# Patient Record
Sex: Female | Born: 1980 | Race: White | Hispanic: No | Marital: Married | Smoking: Never smoker
Health system: Southern US, Community
[De-identification: ages and names within clinical notes are randomized; demographics above are authoritative.]

## PROBLEM LIST (undated history)

## (undated) DIAGNOSIS — R7989 Other specified abnormal findings of blood chemistry: Secondary | ICD-10-CM

## (undated) DIAGNOSIS — R011 Cardiac murmur, unspecified: Secondary | ICD-10-CM

## (undated) DIAGNOSIS — R112 Nausea with vomiting, unspecified: Secondary | ICD-10-CM

## (undated) DIAGNOSIS — R809 Proteinuria, unspecified: Secondary | ICD-10-CM

## (undated) DIAGNOSIS — R945 Abnormal results of liver function studies: Secondary | ICD-10-CM

## (undated) HISTORY — DX: Abnormal results of liver function studies: R94.5

## (undated) HISTORY — PX: SHOULDER SURGERY: SHX246

## (undated) HISTORY — PX: TONSILLECTOMY AND ADENOIDECTOMY: SUR1326

## (undated) HISTORY — PX: PILONIDAL CYST / SINUS EXCISION: SUR543

## (undated) HISTORY — DX: Other specified abnormal findings of blood chemistry: R79.89

## (undated) HISTORY — DX: Proteinuria, unspecified: R80.9

## (undated) HISTORY — PX: FOOT SURGERY: SHX648

## (undated) HISTORY — PX: OTHER SURGICAL HISTORY: SHX169

---

## 1999-06-19 HISTORY — PX: CHOLECYSTECTOMY: SHX55

## 2005-04-23 ENCOUNTER — Other Ambulatory Visit: Admission: RE | Admit: 2005-04-23 | Discharge: 2005-04-23 | Payer: Self-pay | Admitting: Obstetrics & Gynecology

## 2008-01-01 ENCOUNTER — Inpatient Hospital Stay (HOSPITAL_COMMUNITY): Admission: AD | Admit: 2008-01-01 | Discharge: 2008-01-04 | Payer: Self-pay | Admitting: Obstetrics and Gynecology

## 2008-09-16 ENCOUNTER — Inpatient Hospital Stay (HOSPITAL_COMMUNITY): Admission: AD | Admit: 2008-09-16 | Discharge: 2008-09-16 | Payer: Self-pay | Admitting: Obstetrics & Gynecology

## 2009-03-29 ENCOUNTER — Inpatient Hospital Stay (HOSPITAL_COMMUNITY): Admission: RE | Admit: 2009-03-29 | Discharge: 2009-03-30 | Payer: Self-pay | Admitting: Obstetrics & Gynecology

## 2010-09-21 LAB — CBC
HCT: 32.7 % — ABNORMAL LOW (ref 36.0–46.0)
MCHC: 33.5 g/dL (ref 30.0–36.0)
MCV: 85.4 fL (ref 78.0–100.0)
Platelets: 288 10*3/uL (ref 150–400)
RDW: 13.8 % (ref 11.5–15.5)
RDW: 14.3 % (ref 11.5–15.5)

## 2010-09-21 LAB — CCBB MATERNAL DONOR DRAW

## 2010-11-18 ENCOUNTER — Inpatient Hospital Stay (INDEPENDENT_AMBULATORY_CARE_PROVIDER_SITE_OTHER)
Admission: RE | Admit: 2010-11-18 | Discharge: 2010-11-18 | Disposition: A | Discharge status: Home / Self Care | Payer: 59 | Source: Ambulatory Visit | Attending: Family Medicine | Admitting: Family Medicine

## 2010-11-18 DIAGNOSIS — R197 Diarrhea, unspecified: Secondary | ICD-10-CM

## 2010-11-18 DIAGNOSIS — R112 Nausea with vomiting, unspecified: Secondary | ICD-10-CM

## 2011-03-16 LAB — CBC
HCT: 27.4 — ABNORMAL LOW
HCT: 33.6 — ABNORMAL LOW
Hemoglobin: 10.9 — ABNORMAL LOW
Hemoglobin: 11.6 — ABNORMAL LOW
Hemoglobin: 9.2 — ABNORMAL LOW
MCHC: 32.5
MCHC: 33.6
MCV: 90.1
MCV: 90.9
Platelets: 241
Platelets: 301
RBC: 3.02 — ABNORMAL LOW
RBC: 3.73 — ABNORMAL LOW
RBC: 3.8 — ABNORMAL LOW
RDW: 13.5
RDW: 13.7
RDW: 13.7
WBC: 10.3
WBC: 18.5 — ABNORMAL HIGH

## 2011-03-16 LAB — RPR: RPR Ser Ql: NONREACTIVE

## 2013-02-09 ENCOUNTER — Ambulatory Visit
Admission: RE | Admit: 2013-02-09 | Discharge: 2013-02-09 | Disposition: A | Discharge status: Home / Self Care | Payer: 59 | Source: Ambulatory Visit | Attending: Internal Medicine | Admitting: Internal Medicine

## 2013-02-09 ENCOUNTER — Other Ambulatory Visit: Payer: Self-pay | Admitting: Internal Medicine

## 2013-02-09 DIAGNOSIS — R609 Edema, unspecified: Secondary | ICD-10-CM

## 2013-03-17 ENCOUNTER — Other Ambulatory Visit (HOSPITAL_COMMUNITY): Payer: Self-pay | Admitting: Internal Medicine

## 2013-03-17 DIAGNOSIS — I341 Nonrheumatic mitral (valve) prolapse: Secondary | ICD-10-CM

## 2013-03-18 ENCOUNTER — Ambulatory Visit (HOSPITAL_COMMUNITY)
Admission: RE | Admit: 2013-03-18 | Discharge: 2013-03-18 | Disposition: A | Discharge status: Home / Self Care | Payer: 59 | Source: Ambulatory Visit | Attending: Cardiology | Admitting: Cardiology

## 2013-03-18 DIAGNOSIS — I059 Rheumatic mitral valve disease, unspecified: Secondary | ICD-10-CM | POA: Insufficient documentation

## 2013-03-18 DIAGNOSIS — R072 Precordial pain: Secondary | ICD-10-CM

## 2013-03-18 DIAGNOSIS — I079 Rheumatic tricuspid valve disease, unspecified: Secondary | ICD-10-CM | POA: Insufficient documentation

## 2013-03-18 DIAGNOSIS — R079 Chest pain, unspecified: Secondary | ICD-10-CM | POA: Insufficient documentation

## 2013-03-18 DIAGNOSIS — I341 Nonrheumatic mitral (valve) prolapse: Secondary | ICD-10-CM

## 2013-03-18 NOTE — Progress Notes (Signed)
2D Echo Performed 03/18/2013    Marygrace Drought, RCS

## 2013-07-29 ENCOUNTER — Other Ambulatory Visit: Payer: Self-pay | Admitting: Internal Medicine

## 2013-07-29 DIAGNOSIS — R7402 Elevation of levels of lactic acid dehydrogenase (LDH): Secondary | ICD-10-CM

## 2013-07-29 DIAGNOSIS — R740 Nonspecific elevation of levels of transaminase and lactic acid dehydrogenase [LDH]: Principal | ICD-10-CM

## 2013-07-31 ENCOUNTER — Ambulatory Visit
Admission: RE | Admit: 2013-07-31 | Discharge: 2013-07-31 | Disposition: A | Discharge status: Home / Self Care | Payer: 59 | Source: Ambulatory Visit | Attending: Internal Medicine | Admitting: Internal Medicine

## 2013-07-31 DIAGNOSIS — R7402 Elevation of levels of lactic acid dehydrogenase (LDH): Secondary | ICD-10-CM

## 2013-07-31 DIAGNOSIS — R740 Nonspecific elevation of levels of transaminase and lactic acid dehydrogenase [LDH]: Principal | ICD-10-CM

## 2013-08-03 ENCOUNTER — Other Ambulatory Visit: Payer: Self-pay | Admitting: Internal Medicine

## 2013-08-03 DIAGNOSIS — R7989 Other specified abnormal findings of blood chemistry: Secondary | ICD-10-CM

## 2013-08-04 ENCOUNTER — Telehealth: Payer: Self-pay | Admitting: Oncology

## 2013-08-04 ENCOUNTER — Telehealth: Payer: Self-pay | Admitting: Hematology and Oncology

## 2013-08-04 NOTE — Telephone Encounter (Signed)
C/D 08/03/13 for appt. 08/07/13

## 2013-08-04 NOTE — Telephone Encounter (Signed)
new patient scheduled for 02/20 @ 10:45 w/Dr. Alvy Bimler. Referring [name] Dx-Abn UPEP, elevated LFT'S edema Welcome packet emailed.

## 2013-08-07 ENCOUNTER — Ambulatory Visit (HOSPITAL_BASED_OUTPATIENT_CLINIC_OR_DEPARTMENT_OTHER): Payer: 59 | Admitting: Hematology and Oncology

## 2013-08-07 ENCOUNTER — Encounter: Payer: Self-pay | Admitting: Hematology and Oncology

## 2013-08-07 ENCOUNTER — Telehealth: Payer: Self-pay | Admitting: Hematology and Oncology

## 2013-08-07 ENCOUNTER — Ambulatory Visit: Payer: 59

## 2013-08-07 ENCOUNTER — Encounter (INDEPENDENT_AMBULATORY_CARE_PROVIDER_SITE_OTHER): Payer: Self-pay

## 2013-08-07 VITALS — BP 124/77 | HR 58 | Temp 98.2°F | Resp 19 | Wt 198.7 lb

## 2013-08-07 DIAGNOSIS — R809 Proteinuria, unspecified: Secondary | ICD-10-CM

## 2013-08-07 HISTORY — DX: Proteinuria, unspecified: R80.9

## 2013-08-07 NOTE — Progress Notes (Signed)
[facility] CONSULT NOTE  Patient has no care team.  CHIEF COMPLAINTS/PURPOSE OF CONSULTATION:  Abnormal M spike  HISTORY OF PRESENTING ILLNESS:  Maureen Ballard 33 y.o. female is here because of abnormal blood work. The patient was noted to have abnormal liver function test and significant retention of fluids. On 07/28/2013, her primary doctor ordered a serum protein electrophoresis that came back negative. However, a spot urine for protein electrophoresis showed elevated M spike. She's been referred here for further evaluation. She denies history of abnormal bone pain or bone fracture. Patient denies history of recurrent infection or atypical infections such as shingles of meningitis. Denies chills, night sweats, anorexia or abnormal weight loss.  MEDICAL HISTORY:  Past Medical History  Diagnosis Date  . Abnormal LFTs   . Protein, urine, abnormal presence 08/07/2013    SURGICAL HISTORY: Past Surgical History  Procedure Laterality Date  . Foot surgery      4 different surgeries  . Tonsillectomy and adenoidectomy    . Cholecystectomy    . Pilonidal cyst / sinus excision      twice    SOCIAL HISTORY: History   Social History  . Marital Status: Married    Spouse Name: N/A    Number of Children: N/A  . Years of Education: N/A   Occupational History  . Not on file.   Social History Main Topics  . Smoking status: Never Smoker   . Smokeless tobacco: Never Used  . Alcohol Use: No  . Drug Use: No  . Sexual Activity: Not on file   Other Topics Concern  . Not on file   Social History Narrative  . No narrative on file    FAMILY HISTORY: History reviewed. No pertinent family history.  ALLERGIES:  has No Known Allergies.  MEDICATIONS:  Current Outpatient Prescriptions  Medication Sig Dispense Refill  . hydrochlorothiazide (HYDRODIURIL) 25 MG tablet Take 12.5 mg by mouth daily as needed.       No current facility-administered medications for this  visit.    REVIEW OF SYSTEMS:   Eyes: Denies blurriness of vision, double vision or watery eyes Ears, nose, mouth, throat, and face: Denies mucositis or sore throat Respiratory: Denies cough, dyspnea or wheezes Cardiovascular: Denies palpitation, chest discomfort or lower extremity swelling Gastrointestinal:  Denies nausea, heartburn or change in bowel habits Skin: Denies abnormal skin rashes Lymphatics: Denies new lymphadenopathy or easy bruising Neurological:Denies numbness, tingling or new weaknesses Behavioral/Psych: Mood is stable, no new changes  All other systems were reviewed with the patient and are negative.  PHYSICAL EXAMINATION: ECOG PERFORMANCE STATUS: 0 - Asymptomatic  Filed Vitals:   08/07/13 1115  BP: 124/77  Pulse: 58  Temp: 98.2 F (36.8 C)  Resp: 19   Filed Weights   08/07/13 1115  Weight: 198 lb 11.2 oz (90.13 kg)    GENERAL:alert, no distress and comfortable SKIN: skin color, texture, turgor are normal, no rashes or significant lesions EYES: normal, conjunctiva are pink and non-injected, sclera clear OROPHARYNX:no exudate, no erythema and lips, buccal mucosa, and tongue normal  NECK: supple, thyroid normal size, non-tender, without nodularity LYMPH:  no palpable lymphadenopathy in the cervical, axillary or inguinal LUNGS: clear to auscultation and percussion with normal breathing effort HEART: regular rate & rhythm and no murmurs and no lower extremity edema ABDOMEN:abdomen soft, non-tender and normal bowel sounds Musculoskeletal:no cyanosis of digits and no clubbing  PSYCH: alert & oriented x 3 with fluent speech NEURO: no focal motor/sensory deficits  LABORATORY DATA:  I have reviewed the data as listed Lab Results  Component Value Date   WBC 11.9* 03/30/2009   HGB 9.5* 03/30/2009   HCT 28.3* 03/30/2009   MCV 86.4 03/30/2009   PLT 224 03/30/2009    RADIOGRAPHIC STUDIES: I have personally reviewed the radiological images as listed and  agreed with the findings in the report. US Abdomen Complete  07/31/2013   CLINICAL DATA:  Elevated LDH  EXAM: ULTRASOUND ABDOMEN COMPLETE  COMPARISON:  None.  FINDINGS: Gallbladder:  Surgically removed.  Common bile duct:  Diameter: 3.8 mm.  Liver:  No focal lesion identified. Within normal limits in parenchymal echogenicity.  IVC:  No abnormality visualized.  Pancreas:  Visualized portion unremarkable.  Spleen:  Size and appearance within normal limits.  Right Kidney:  Length: 12.3 cm. Echogenicity within normal limits. No mass or hydronephrosis visualized.  Left Kidney:  Length: 11.3 cm. Echogenicity within normal limits. No mass or hydronephrosis visualized.  Abdominal aorta:  No aneurysm visualized.  Other findings:  None.  IMPRESSION: No acute abnormality is noted.   Electronically Signed   By: Inez Catalina M.D.   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 I recommend complete blood work and 24 hour urine collection for UPEP  I will see her back in 2 weeks to review test results. Orders Placed This Encounter  Procedures  . CBC with Differential    Standing Status: Future     Number of Occurrences:      Standing Expiration Date: 08/07/2014  . Comprehensive metabolic panel    Standing Status: Future     Number of Occurrences:      Standing Expiration Date: 08/07/2014  . IFE, Urine (with Tot Prot)    Standing Status: Future     Number of Occurrences:      Standing Expiration Date: 08/07/2014  . SPEP & IFE with QIG    Standing Status: Future     Number of Occurrences:      Standing Expiration Date: 08/07/2014  . Kappa/lambda light chains    Standing Status: Future     Number of Occurrences:      Standing Expiration Date: 08/07/2014  . Protein Electro, 24-Hour Urine    Standing Status: Future     Number of Occurrences:      Standing  Expiration Date: 08/07/2014    All questions were answered. The patient knows to call the clinic with any problems, questions or concerns. I spent 30 minutes counseling the patient face to face. The total time spent in the appointment was 40 minutes and more than 50% was on counseling.     Owings, Hallam, MD 08/07/2013 1:35 PM

## 2013-08-07 NOTE — Telephone Encounter (Signed)
gv adn printed appt sched and avs forpt for Feb and March

## 2013-08-11 ENCOUNTER — Telehealth: Payer: Self-pay | Admitting: Hematology and Oncology

## 2013-08-11 NOTE — Telephone Encounter (Signed)
returned pt call and r/s lab to later time due to school delay tomorrow....done...pt aware of new time

## 2013-08-12 ENCOUNTER — Other Ambulatory Visit: Payer: 59

## 2013-08-12 ENCOUNTER — Other Ambulatory Visit (HOSPITAL_BASED_OUTPATIENT_CLINIC_OR_DEPARTMENT_OTHER): Payer: 59

## 2013-08-12 DIAGNOSIS — R809 Proteinuria, unspecified: Secondary | ICD-10-CM

## 2013-08-12 LAB — CBC WITH DIFFERENTIAL/PLATELET
BASO%: 0.5 % (ref 0.0–2.0)
Basophils Absolute: 0 10*3/uL (ref 0.0–0.1)
EOS%: 3.2 % (ref 0.0–7.0)
Eosinophils Absolute: 0.2 10*3/uL (ref 0.0–0.5)
HCT: 44.3 % (ref 34.8–46.6)
HEMOGLOBIN: 14.7 g/dL (ref 11.6–15.9)
LYMPH#: 1.9 10*3/uL (ref 0.9–3.3)
LYMPH%: 33.7 % (ref 14.0–49.7)
MCH: 30.5 pg (ref 25.1–34.0)
MCHC: 33.2 g/dL (ref 31.5–36.0)
MCV: 91.8 fL (ref 79.5–101.0)
MONO#: 0.5 10*3/uL (ref 0.1–0.9)
MONO%: 9.5 % (ref 0.0–14.0)
NEUT#: 2.9 10*3/uL (ref 1.5–6.5)
NEUT%: 53.1 % (ref 38.4–76.8)
Platelets: 284 10*3/uL (ref 145–400)
RBC: 4.83 10*6/uL (ref 3.70–5.45)
RDW: 13.1 % (ref 11.2–14.5)
WBC: 5.5 10*3/uL (ref 3.9–10.3)

## 2013-08-12 LAB — COMPREHENSIVE METABOLIC PANEL (CC13)
ALBUMIN: 3.6 g/dL (ref 3.5–5.0)
ALT: 25 U/L (ref 0–55)
AST: 20 U/L (ref 5–34)
Alkaline Phosphatase: 43 U/L (ref 40–150)
Anion Gap: 8 mEq/L (ref 3–11)
BUN: 13.1 mg/dL (ref 7.0–26.0)
CALCIUM: 9.1 mg/dL (ref 8.4–10.4)
CHLORIDE: 103 meq/L (ref 98–109)
CO2: 29 mEq/L (ref 22–29)
Creatinine: 0.9 mg/dL (ref 0.6–1.1)
Glucose: 92 mg/dl (ref 70–140)
POTASSIUM: 4 meq/L (ref 3.5–5.1)
SODIUM: 140 meq/L (ref 136–145)
TOTAL PROTEIN: 6.2 g/dL — AB (ref 6.4–8.3)
Total Bilirubin: 0.59 mg/dL (ref 0.20–1.20)

## 2013-08-17 LAB — SPEP & IFE WITH QIG
ALBUMIN ELP: 60.2 % (ref 55.8–66.1)
Alpha-1-Globulin: 5.3 % — ABNORMAL HIGH (ref 2.9–4.9)
Alpha-2-Globulin: 10.2 % (ref 7.1–11.8)
BETA 2: 4.2 % (ref 3.2–6.5)
Beta Globulin: 8 % — ABNORMAL HIGH (ref 4.7–7.2)
Gamma Globulin: 12.1 % (ref 11.1–18.8)
IGA: 213 mg/dL (ref 69–380)
IGM, SERUM: 55 mg/dL (ref 52–322)
IgG (Immunoglobin G), Serum: 699 mg/dL (ref 690–1700)
Total Protein, Serum Electrophoresis: 6.2 g/dL (ref 6.0–8.3)

## 2013-08-17 LAB — UIFE/LIGHT CHAINS/TP QN, 24-HR UR
Albumin, U: DETECTED
FREE KAPPA LT CHAINS, UR: 0.45 mg/dL (ref 0.14–2.42)
FREE KAPPA/LAMBDA RATIO: 11.25 ratio — AB (ref 2.04–10.37)
Free Lambda Excretion/Day: 1.2 mg/d
Free Lambda Lt Chains,Ur: 0.04 mg/dL (ref 0.02–0.67)
Free Lt Chn Excr Rate: 13.5 mg/d
TOTAL PROTEIN, URINE-UPE24: 1 mg/dL
Time: 24 hours
Total Protein, Urine-Ur/day: 30 mg/d (ref 10–140)
Volume, Urine: 3000 mL

## 2013-08-17 LAB — KAPPA/LAMBDA LIGHT CHAINS
KAPPA LAMBDA RATIO: 1.44 (ref 0.26–1.65)
Kappa free light chain: 1.21 mg/dL (ref 0.33–1.94)
Lambda Free Lght Chn: 0.84 mg/dL (ref 0.57–2.63)

## 2013-08-25 ENCOUNTER — Telehealth: Payer: Self-pay | Admitting: *Deleted

## 2013-08-25 ENCOUNTER — Ambulatory Visit: Payer: 59 | Admitting: Hematology and Oncology

## 2013-08-25 NOTE — Telephone Encounter (Signed)
Informed pt of no need for office visit with Dr. Alvy Bimler.  Informed her all her tests are "clear" per Dr. Calton Dach standpoint and pt can f/u with her PCP.  Pt verbalized understanding.

## 2014-03-01 ENCOUNTER — Ambulatory Visit
Admission: RE | Admit: 2014-03-01 | Discharge: 2014-03-01 | Disposition: A | Discharge status: Home / Self Care | Payer: No Typology Code available for payment source | Source: Ambulatory Visit | Attending: Occupational Medicine | Admitting: Occupational Medicine

## 2014-03-01 ENCOUNTER — Other Ambulatory Visit: Payer: Self-pay | Admitting: Occupational Medicine

## 2014-03-01 DIAGNOSIS — Z021 Encounter for pre-employment examination: Secondary | ICD-10-CM

## 2014-06-18 HISTORY — PX: FRACTURE SURGERY: SHX138

## 2014-06-21 ENCOUNTER — Other Ambulatory Visit: Payer: Self-pay | Admitting: Occupational Medicine

## 2014-06-21 ENCOUNTER — Ambulatory Visit: Payer: Self-pay

## 2014-06-21 DIAGNOSIS — M25512 Pain in left shoulder: Secondary | ICD-10-CM

## 2014-06-21 DIAGNOSIS — M25522 Pain in left elbow: Secondary | ICD-10-CM

## 2015-03-15 ENCOUNTER — Encounter: Payer: Self-pay | Admitting: Sports Medicine

## 2015-03-15 ENCOUNTER — Ambulatory Visit (INDEPENDENT_AMBULATORY_CARE_PROVIDER_SITE_OTHER): Payer: Commercial Managed Care - HMO | Admitting: Sports Medicine

## 2015-03-15 VITALS — BP 120/70 | Ht 69.0 in | Wt 200.0 lb

## 2015-03-15 DIAGNOSIS — I8393 Asymptomatic varicose veins of bilateral lower extremities: Secondary | ICD-10-CM | POA: Diagnosis not present

## 2015-03-15 DIAGNOSIS — M7051 Other bursitis of knee, right knee: Secondary | ICD-10-CM | POA: Diagnosis not present

## 2015-03-15 MED ORDER — NITROGLYCERIN 0.2 MG/HR TD PT24: MEDICATED_PATCH | TRANSDERMAL | Status: DC

## 2015-03-15 NOTE — Assessment & Plan Note (Signed)
34 year old healthy woman with pain below right patella 8 months s/p acute trauma.  Recent xrays (done at outside facility) were reviewed with Dr. Oneida Alar and there is no evidence of patellar fracture or other bony abnormality.  Her exam reveals no ligamentous or meniscal injury.  She has a small dip in her skin just below the knee and US done in office reveals evidence of soft tissue tear.  Given these findings and hx of fall with significant injury, her pain is likely due to traumatic bursitis due to small tear in soft tissue.  The following recommendations were made to the patient. - ACE bandage - will try NTG (1/4 patch) applied to affected area on knee daily (proven effective for rotator cuff, elbow and achilles pathology so will see if it works to improve healing in this area) - return for repeat US in 6 weeks to see if the soft tissue is healing

## 2015-03-15 NOTE — Progress Notes (Signed)
Subjective:    Patient ID: Maureen Ballard, female    DOB: [birth year], 34 y.o.   MRN: [id number]  HPI Comments: Ms. Maureen Ballard is a 34 year old woman with PMH as below here with complaint of chronic knee pain x 9 months s/p fall during [employer] training.  In Jan 2016, she was taking part in training exercise and fell down 1 flight of stairs landing on face down with upper torso at bottom of stairs and legs on the upper stairs.  She hit her knee but did not feel or hear a pop.  She suffered left rotator cuff tear and left elbow dislocation and is s/p surgery to both of these areas and an additional surgery is planned for the left elbow in two months.  She is seeing Dr. Theda Sers for her rotator cuff and Dr. Jacelyn Grip for her elbow.  She is here specifically for the right knee pain since the fall.  She reports having an open wound to the left knee just below the patella immediately after the fall that healed.  The knee was giving her a little pain but this increased after she began doing more intense full body PT in May 2016.  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  She notes knee pain that is just below patella but radiates to medial joint line.  The pain is tolerable with usual activity but increases to 6/10 with cardiac exercise/running.  She finds the pain to be at its worse the day after intense exercise.  She also notices some swelling following activity.  She has not tried pain medications/NSAIDs or compression.  She finds icing helpful.      Past Medical History  Diagnosis Date  . Abnormal LFTs   . Protein, urine, abnormal presence 08/07/2013   Current Outpatient Prescriptions on File Prior to Visit  Medication Sig Dispense Refill  . hydrochlorothiazide (HYDRODIURIL) 25 MG tablet Take 12.5 mg by mouth daily as needed.     No current facility-administered medications on file prior to visit.    Review of Systems    Constitutional: Negative for appetite change and unexpected weight change.  Eyes: Negative for visual disturbance.  Gastrointestinal: Negative for nausea, vomiting, abdominal pain, diarrhea and constipation.       No change in bowel or bladder  Musculoskeletal:       Pain below right patella per HPI   Hx of varicosities in her family and she gets swelling and some vv bruising     Filed Vitals:   03/15/15 1023 03/15/15 1024  BP:  120/70  Height: 5' 9"  (1.753 m)   Weight: 200 lb (90.719 kg)     Objective:   Physical Exam  Constitutional: She is oriented to person, place, and time. She appears well-developed. No distress.  HENT:  Head: Normocephalic and atraumatic.  Mouth/Throat: Oropharynx is clear and moist. No oropharyngeal exudate.  Eyes: EOM are normal. Right eye exhibits no discharge. Left eye exhibits no discharge. No scleral icterus.  Neck: Neck supple.  Musculoskeletal: Normal range of motion. She exhibits edema. She exhibits no tenderness.  Full ROM at knee joint B/L; no joint effusion; on right - neg Lachman, neg McMurray, neg anterior/posterior drawer, neg valgus/varus stress, neg Apley compression/distraction.  Right calf 42cm, left calf 40.5cm   Neurological: She is alert and oriented to person, place, and time. She displays normal reflexes. No cranial nerve deficit.  5/5 strength lower extremities.  4-/5 strength left upper extremity, 5/5 strength right upper extremity; sensation grossly intact  Skin: Skin is warm and dry. She is not diaphoretic.  Psychiatric: She has a normal mood and affect. Her behavior is normal. Judgment and thought content normal.    [place] of RT knee and leg No SPP effusion Normal QT and PT Norm ,medial and lateral meniscus  Soft tissue just lateral to tib tubercle shows hypoechoic change consistent with pseudobursa Deep and superficial varicoisitis noted  RT knee XR is normal      Assessment & Plan:  Please see problem based charting for  assessment and plan.

## 2015-03-15 NOTE — Patient Instructions (Addendum)
Nitroglycerin Protocol   Apply 1/4 nitroglycerin patch to affected area daily.  Change position of patch within the affected area every 24 hours.  You may experience a headache during the first 1-2 weeks of using the patch, these should subside.  If you experience headaches after beginning nitroglycerin patch treatment, you may take your preferred over the counter pain reliever.  Another side effect of the nitroglycerin patch is skin irritation or rash related to patch adhesive.  Please notify our office if you develop more severe headaches or rash, and stop the patch.  Tendon healing with nitroglycerin patch may require 12 to 24 weeks depending on the extent of injury.  Men should not use if taking Viagra, Cialis, or Levitra.   Do not use if you have migraines or rosacea.   Please perform exercises we discussed Return in 6 weeks for ultrasound appointment

## 2015-03-15 NOTE — Assessment & Plan Note (Addendum)
Evident on exam and also able to see significant varicosities on [place].  This is likely contributing to slightly edematous right leg.  There is no calf pain and low risk for DVT (Wells score 0).  The following recommendations were given to patient. - compression socks/Wrangler boot socks - leg exercises - handout provided - can consider vitamin C (won't hurt and may be helpful), 30m ASA daily

## 2015-04-27 ENCOUNTER — Ambulatory Visit: Payer: Self-pay | Admitting: Sports Medicine

## 2015-08-11 ENCOUNTER — Other Ambulatory Visit: Payer: Self-pay | Admitting: Internal Medicine

## 2015-08-11 DIAGNOSIS — I8393 Asymptomatic varicose veins of bilateral lower extremities: Secondary | ICD-10-CM

## 2015-08-24 ENCOUNTER — Other Ambulatory Visit: Payer: Self-pay

## 2015-08-30 ENCOUNTER — Other Ambulatory Visit: Payer: Self-pay

## 2015-08-30 ENCOUNTER — Ambulatory Visit
Admission: RE | Admit: 2015-08-30 | Discharge: 2015-08-30 | Disposition: A | Discharge status: Home / Self Care | Payer: Commercial Managed Care - HMO | Source: Ambulatory Visit | Attending: Internal Medicine | Admitting: Internal Medicine

## 2015-08-30 DIAGNOSIS — I8393 Asymptomatic varicose veins of bilateral lower extremities: Secondary | ICD-10-CM

## 2015-08-30 NOTE — Consult Note (Signed)
Chief Complaint: I have painful varicose veins.   Referring Physician(s): Dr. Jani Gravel.   History of Present Illness: Maureen Ballard is a 35 y.o. female presenting to [facility] clinic today as a scheduled appointment, kindly referred by [name], for evaluation of her lower extremity varicose veins and possible venous insufficiency as an etiology.    Ms Maureen Ballard tells me that she has noticed, over the past year or so, increasing prominence of veins on her left greater than right lower extremity. She tells me that she also has associated throbbing pain of the left greater than right leg, at its worst and intensity of 4 or 5/10. She has no history of a prior DVT or blood clot. She has no history of any prior skin changes of the lower extremity. She has never had a prior spontaneous ulcer or wound of her lower extremity.  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 At the time of the injury, she also had a soft tissue injury to the proximal anterior right lower leg, just below her knee. She tells me that this injury required lengthy time for healing, and did get an infection though she treated this with local wound care at her house and did not require any hospitalization. It does not seem that the development of her venous varicosities is temporarily related to the injury, and she did not have a DVT at this time.  She does report a family history of venous disease, with her mother. She has a woman who has had 2 pregnancies and 2 children. She continues to exercise, though does find that she has occasional symptoms of lower extremity pain with exercise.  I inquired about symptoms of pelvic venous insufficiency, and though she does report dyspareunia, she denies any external or vulvar varicosities, and thus I feel that there is a low  chance of any pelvic congestion syndrome/venous insufficiency.    A complete directed duplex was performed on today's date, which demonstrates no evidence of DVT involving the left or right lower extremity. She has no evidence of venous reflux involving the great saphenous vein or the left circumflex saphenous vein on the left or right lower extremity. She has incidental reticular veins/varicosities of the bilateral lower extremities.    Past Medical History  Diagnosis Date  . Abnormal LFTs   . Protein, urine, abnormal presence 08/07/2013    Past Surgical History  Procedure Laterality Date  . Foot surgery      4 different surgeries  . Tonsillectomy and adenoidectomy    . Cholecystectomy    . Pilonidal cyst / sinus excision      twice  . Fracture surgery  2016     Left Elbow    Allergies: Review of patient's allergies indicates no known allergies.  Medications: Prior to Admission medications   Medication Sig Start Date End Date Taking? Authorizing Provider  hydrochlorothiazide (HYDRODIURIL) 25 MG tablet Take 12.5 mg by mouth daily as needed. Reported on 08/30/2015 06/29/13   Historical Provider, MD  nitroGLYCERIN (NITRODUR - DOSED IN MG/24 HR) 0.2 mg/hr patch Place 1/4 patch to affected area daily Patient not taking: Reported on 08/30/2015 03/15/15   Stefanie Libel, MD     No family history on file.  Social History   Social History  . Marital Status: Married    Spouse Name:  N/A  . Number of Children: N/A  . Years of Education: N/A   Social History Main Topics  . Smoking status: Never Smoker   . Smokeless tobacco: Never Used  . Alcohol Use: No  . Drug Use: No  . Sexual Activity: Not on file   Other Topics Concern  . Not on file   Social History Narrative     Review of Systems: A 12 point ROS discussed and pertinent positives are indicated in the HPI above.  All other systems are negative.  Review of Systems  Vital Signs: BP 128/69 mmHg  Pulse 55  Temp(Src) 98.3  F (36.8 C) (Oral)  Resp 15  Ht 5' 9"  (1.753 m)  Wt 200 lb (90.719 kg)  BMI 29.52 kg/m2  SpO2 96%  Physical Exam Atraumatic normocephalic mucous membranes moist pink. Conjugate gaze. No scleral icterus or scleral injection. Alert and oriented to person place and time. Appropriate affect and insight. Appropriate questions. Neck soft supple. Full range of motion cervical region. Symmetric chest excursion on inspiration and expiration. No labored breathing. Abdomen soft nontender without muscle guarding or rigidity. Genitourinary exam deferred. Moving all 4 extremities equally and grossly with gross motor and gross sensory intact. Lower extremities demonstrate no pitting edema. No skin changes of stasis dermatitis. No lipodermatosclerosis.  No wounds of the lower extremities. Faint varicose vein/reticular vein of the left distal thigh/proximal calf on the lateral aspect.   Imaging: US Venous Img Lower Bilateral  08/30/2015  CLINICAL DATA:  35 year old female with a history of onset of venous varicosities over the past year, with lower extremity pain and concern for venous reflux as etiology. EXAM: BILATERAL LOWER EXTREMITY VENOUS DOPPLER ULTRASOUND TECHNIQUE: Gray-scale sonography with graded compression, as well as color Doppler and duplex ultrasound were performed to evaluate the lower extremity deep venous systems from the level of the common femoral vein and including the common femoral, femoral, profunda femoral, popliteal and calf veins including the posterior tibial, peroneal and gastrocnemius veins when visible. The superficial great saphenous vein was also interrogated. Spectral Doppler was utilized to evaluate flow at rest and with distal augmentation maneuvers in the common femoral, femoral and popliteal veins. COMPARISON:  None. FINDINGS: RIGHT LOWER EXTREMITY Common Femoral Vein: No evidence of thrombus. Normal compressibility, respiratory phasicity and response to augmentation.  Saphenofemoral Junction: No evidence of thrombus. Normal compressibility and flow on color Doppler imaging. Profunda Femoral Vein: No evidence of thrombus. Normal compressibility and flow on color Doppler imaging. Femoral Vein: No evidence of thrombus. Normal compressibility, respiratory phasicity and response to augmentation. Popliteal Vein: No evidence of thrombus. Normal compressibility, respiratory phasicity and response to augmentation. Calf Veins: No evidence of thrombus. Normal compressibility and flow on color Doppler imaging. GSV at [facility]:  No reflux GSV at Proximal Thigh:  No reflux GSV at Mid Thigh:  No reflux GSV at Distal Thigh:  None GSV at Prox Calf: Diameter = none GSV at Mid Calf: Diameter = none GSV at Distal Calf: Diameter = none SSV at Sapheno popliteal junction: Diameter = none SSV at mid calf: Diameter = none SSF at distal calf: Diameter = none Varicosities: The patient does have reticular veins/ varicosities of the right distal thigh and proximal calf. Other Findings:  None. LEFT LOWER EXTREMITY Common Femoral Vein: No evidence of thrombus. Normal compressibility, respiratory phasicity and response to augmentation. Saphenofemoral Junction: No evidence of thrombus. Normal compressibility and flow on color Doppler imaging. Profunda Femoral Vein: No evidence of thrombus. Normal compressibility and flow  on color Doppler imaging. Femoral Vein: No evidence of thrombus. Normal compressibility, respiratory phasicity and response to augmentation. Popliteal Vein: No evidence of thrombus. Normal compressibility, respiratory phasicity and response to augmentation. Calf Veins: No evidence of thrombus. Normal compressibility and flow on color Doppler imaging. GSV at [facility]:  Diameter = No reflux GSV at Proximal Thigh: No reflux GSV at Mid Thigh: No reflux GSV at Distal Thigh: No reflux GSV at Prox Calf: No reflux GSV at Mid Calf: No reflux GSV at Distal Calf: No reflux SSV at Sapheno popliteal junction: No  reflux SSV at mid calf: No reflux SSF at distal calf: No reflux Varicosities: The patient does have reticular veins of the distal left thigh and proximal calf. Other Findings:  None. IMPRESSION: Sonographic survey of the bilateral lower extremities negative for DVT. No evidence of reflux involving the left or right great saphenous vein or small saphenous vein. Signed, Dulcy Fanny. Earleen Newport, DO [facility] Electronically Signed   By: Corrie Mckusick D.O.   On: 08/30/2015 11:45    Labs:  CBC: No results for input(s): WBC, HGB, HCT, PLT in the last 8760 hours.  COAGS: No results for input(s): INR, APTT in the last 8760 hours.  BMP: No results for input(s): NA, K, CL, CO2, GLUCOSE, BUN, CALCIUM, CREATININE, GFRNONAA, GFRAA in the last 8760 hours.  Invalid input(s): CMP  LIVER FUNCTION TESTS: No results for input(s): BILITOT, AST, ALT, ALKPHOS, PROT, ALBUMIN in the last 8760 hours.  TUMOR MARKERS: No results for input(s): AFPTM, CEA, CA199, CHROMGRNA in the last 8760 hours.  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 On duplex exam, she has a normal deep venous system, and no evidence of venous insufficiency as an etiology. She does have mild reticular veins/venous varicosities on the duplex, exam. I had a long discussion with her regarding the anatomy, pathology/pathophysiology, and potential treatment methods of venous insufficiency. I did talk to her about her absence of venous insufficiency, and that any endovenous therapy would likely not improve her symptoms. I also discussed with her conservative therapy, and the potential benefits of compression stocking therapy. We had a brief discussion about asthmatic indications for treatment, although this is typically not covered by insurance plans and performed with an  out-of-pocket payment. Regarding our discussion of the natural history of venous disease, I did talk to her about the possibility that venous disease can progress, although I see no need at this point in scheduling her for routine duplex surveillance, unless her symptoms progress significantly or she has worsening of the varicose disease. I answered all of her questions to the best of my ability, and she agrees with and understands her plan of care.  In summary, Ms Vanhook is a 35 year old woman with CEAP-1 category disease, with no measurable venous reflux.  Our plan will be to prescribe 15-20 mmHg compression thigh-high stockings, to be worn during the day time.  Thank you for this interesting consult.  I greatly enjoyed meeting ELANAH OSMANOVIC and look forward to participating in their care.  A copy of this report was sent to the requesting provider on this date.  Electronically Signed: Corrie Mckusick 08/30/2015, 11:50 AM   I spent a total of  40 Minutes   in face to face in clinical consultation, greater than 50% of which was counseling/coordinating care for lower extremity  varicose veins, and possible venous insufficiency.

## 2016-08-15 DIAGNOSIS — M7751 Other enthesopathy of right foot: Secondary | ICD-10-CM | POA: Diagnosis not present

## 2016-08-15 DIAGNOSIS — M7741 Metatarsalgia, right foot: Secondary | ICD-10-CM | POA: Diagnosis not present

## 2017-06-17 DIAGNOSIS — M1812 Unilateral primary osteoarthritis of first carpometacarpal joint, left hand: Secondary | ICD-10-CM | POA: Diagnosis not present

## 2017-06-22 ENCOUNTER — Other Ambulatory Visit: Payer: Self-pay

## 2017-06-22 ENCOUNTER — Encounter (HOSPITAL_BASED_OUTPATIENT_CLINIC_OR_DEPARTMENT_OTHER): Payer: Self-pay | Admitting: *Deleted

## 2017-06-22 DIAGNOSIS — Z79899 Other long term (current) drug therapy: Secondary | ICD-10-CM

## 2017-06-22 DIAGNOSIS — R1084 Generalized abdominal pain: Secondary | ICD-10-CM

## 2017-06-22 DIAGNOSIS — R1110 Vomiting, unspecified: Secondary | ICD-10-CM | POA: Diagnosis not present

## 2017-06-22 MED ORDER — GI COCKTAIL ~~LOC~~
30.0000 mL | Freq: Once | ORAL | Status: AC
  Administered 2017-06-22: 30 mL via ORAL
  Filled 2017-06-22: qty 30

## 2017-06-22 NOTE — ED Notes (Signed)
Pt reports she had dilaudid left over from prior surgery. Took rolaids and a muscle relaxer also. States she took melatonin to try to sleep and vomited

## 2017-06-22 NOTE — ED Triage Notes (Signed)
Pt reports epigastric pain for several days. States she vomited x 1 tonight. Pt states she took dilaudid without relief

## 2017-06-23 ENCOUNTER — Emergency Department (HOSPITAL_BASED_OUTPATIENT_CLINIC_OR_DEPARTMENT_OTHER): Payer: 59

## 2017-06-23 ENCOUNTER — Encounter (HOSPITAL_BASED_OUTPATIENT_CLINIC_OR_DEPARTMENT_OTHER): Payer: Self-pay

## 2017-06-23 ENCOUNTER — Emergency Department (HOSPITAL_BASED_OUTPATIENT_CLINIC_OR_DEPARTMENT_OTHER)
Admission: EM | Admit: 2017-06-23 | Discharge: 2017-06-23 | Disposition: A | Discharge status: Home / Self Care | Payer: 59 | Source: Home / Self Care | Attending: Physician Assistant | Admitting: Physician Assistant

## 2017-06-23 DIAGNOSIS — R1084 Generalized abdominal pain: Secondary | ICD-10-CM

## 2017-06-23 DIAGNOSIS — R1110 Vomiting, unspecified: Secondary | ICD-10-CM | POA: Diagnosis not present

## 2017-06-23 LAB — COMPREHENSIVE METABOLIC PANEL
ALK PHOS: 50 U/L (ref 38–126)
ALT: 39 U/L (ref 14–54)
AST: 37 U/L (ref 15–41)
Albumin: 4.1 g/dL (ref 3.5–5.0)
Anion gap: 9 (ref 5–15)
BILIRUBIN TOTAL: 0.6 mg/dL (ref 0.3–1.2)
BUN: 10 mg/dL (ref 6–20)
CALCIUM: 9 mg/dL (ref 8.9–10.3)
CO2: 25 mmol/L (ref 22–32)
CREATININE: 0.82 mg/dL (ref 0.44–1.00)
Chloride: 102 mmol/L (ref 101–111)
GFR calc Af Amer: >60 mL/min (ref >60)
GFR calc non Af Amer: >60 mL/min (ref >60)
GLUCOSE: 105 mg/dL — AB (ref 65–99)
Potassium: 3.7 mmol/L (ref 3.5–5.1)
SODIUM: 136 mmol/L (ref 135–145)
Total Protein: 6.9 g/dL (ref 6.5–8.1)

## 2017-06-23 LAB — URINALYSIS, MICROSCOPIC (REFLEX)

## 2017-06-23 LAB — CBC
HCT: 38.2 % (ref 36.0–46.0)
Hemoglobin: 13.5 g/dL (ref 12.0–15.0)
MCH: 32 pg (ref 26.0–34.0)
MCHC: 35.3 g/dL (ref 30.0–36.0)
MCV: 90.5 fL (ref 78.0–100.0)
PLATELETS: 344 10*3/uL (ref 150–400)
RBC: 4.22 MIL/uL (ref 3.87–5.11)
RDW: 11.8 % (ref 11.5–15.5)
WBC: 5.7 10*3/uL (ref 4.0–10.5)

## 2017-06-23 LAB — URINALYSIS, ROUTINE W REFLEX MICROSCOPIC
BILIRUBIN URINE: NEGATIVE
Glucose, UA: NEGATIVE mg/dL
KETONES UR: NEGATIVE mg/dL
Leukocytes, UA: NEGATIVE
Nitrite: NEGATIVE
PROTEIN: NEGATIVE mg/dL
SPECIFIC GRAVITY, URINE: 1.015 (ref 1.005–1.030)
pH: 6 (ref 5.0–8.0)

## 2017-06-23 LAB — LIPASE, BLOOD: Lipase: 26 U/L (ref 11–51)

## 2017-06-23 LAB — PREGNANCY, URINE: PREG TEST UR: NEGATIVE

## 2017-06-23 MED ORDER — SUCRALFATE 1 GM/10ML PO SUSP
1.0000 g | Freq: Once | ORAL | Status: AC
  Administered 2017-06-23: 1 g via ORAL
  Filled 2017-06-23: qty 10

## 2017-06-23 MED ORDER — OXYCODONE-ACETAMINOPHEN 5-325 MG PO TABS
1.0000 | ORAL_TABLET | Freq: Once | ORAL | Status: AC
  Administered 2017-06-23: 1 via ORAL
  Filled 2017-06-23: qty 1

## 2017-06-23 MED ORDER — OMEPRAZOLE 20 MG PO CPDR: 20.0000 mg | DELAYED_RELEASE_CAPSULE | Freq: Every day | ORAL | 0 refills | Status: DC

## 2017-06-23 MED ORDER — SUCRALFATE 1 GM/10ML PO SUSP: 1.0000 g | Freq: Three times a day (TID) | ORAL | 0 refills | Status: DC

## 2017-06-23 MED ORDER — OXYCODONE-ACETAMINOPHEN 5-325 MG PO TABS: 1.0000 | ORAL_TABLET | Freq: Four times a day (QID) | ORAL | 0 refills | Status: DC | PRN

## 2017-06-23 MED ORDER — IOPAMIDOL (ISOVUE-300) INJECTION 61%
100.0000 mL | Freq: Once | INTRAVENOUS | Status: AC | PRN
  Administered 2017-06-23: 100 mL via INTRAVENOUS

## 2017-06-23 MED ORDER — FENTANYL CITRATE (PF) 100 MCG/2ML IJ SOLN
50.0000 ug | Freq: Once | INTRAMUSCULAR | Status: AC
  Administered 2017-06-23: 50 ug via INTRAVENOUS
  Filled 2017-06-23: qty 2

## 2017-06-23 NOTE — ED Notes (Signed)
ED Provider at bedside. 

## 2017-06-23 NOTE — ED Provider Notes (Signed)
[place] EMERGENCY DEPARTMENT Provider Note   CSN: [id number] Arrival date & time: 06/22/17  2244     History   Chief Complaint Chief Complaint  Patient presents with  . Abdominal Pain    HPI Maureen Ballard is a 37 y.o. female.  HPI  Patient is a 37 year old female presenting with abdominal pain.  Patient says it starts a couple days ago.  She reports that it was initially in her epigastric area.  Now she reports she has some pain in her right CVA area.  She reports she vomited one time yesterday.  No vomiting otherwise.  No diarrhea.  No fevers.  No recent change in food intake.  No alcohol use.  Past Medical History:  Diagnosis Date  . Abnormal LFTs   . Protein, urine, abnormal presence 08/07/2013    Patient Active Problem List   Diagnosis Date Noted  . Infrapatellar bursitis of right knee 03/15/2015  . Varicose veins of both lower extremities 03/15/2015  . Protein, urine, abnormal presence 08/07/2013    Past Surgical History:  Procedure Laterality Date  . CHOLECYSTECTOMY    . FOOT SURGERY     4 different surgeries  . FRACTURE SURGERY  2016    Left Elbow  . PILONIDAL CYST / SINUS EXCISION     twice  . SHOULDER SURGERY    . TONSILLECTOMY AND ADENOIDECTOMY      OB History    No data available       Home Medications    Prior to Admission medications   Medication Sig Start Date End Date Taking? Authorizing Provider  hydrochlorothiazide (HYDRODIURIL) 25 MG tablet Take 12.5 mg by mouth daily as needed. Reported on 08/30/2015 06/29/13  Yes [provider]  nitroGLYCERIN (NITRODUR - DOSED IN MG/24 HR) 0.2 mg/hr patch Place 1/4 patch to affected area daily 03/15/15   Stefanie Libel, MD  omeprazole (PRILOSEC) 20 MG capsule Take 1 capsule (20 mg total) by mouth daily. 06/23/17   Jeanice Dempsey Lyn, MD  oxyCODONE-acetaminophen (PERCOCET/ROXICET) 5-325 MG tablet Take 1 tablet by mouth every 6 (six) hours as needed for severe pain. 06/23/17    Brookelynn Hamor Lyn, MD  sucralfate (CARAFATE) 1 GM/10ML suspension Take 10 mLs (1 g total) by mouth 4 (four) times daily -  with meals and at bedtime. 06/23/17   Tomekia Helton, Fredia Sorrow, MD    Family History No family history on file.  Social History Social History   Tobacco Use  . Smoking status: Never Smoker  . Smokeless tobacco: Never Used  Substance Use Topics  . Alcohol use: No  . Drug use: No     Allergies   Hydrocodone   Review of Systems Review of Systems  Constitutional: Negative for activity change and fever.  Respiratory: Negative for shortness of breath.   Cardiovascular: Negative for chest pain.  Gastrointestinal: Positive for abdominal pain and vomiting. Negative for blood in stool, diarrhea and nausea.  Genitourinary: Positive for flank pain. Negative for menstrual problem.  Musculoskeletal: Negative for back pain.     Physical Exam Updated Vital Signs BP (!) 152/73 (BP Location: Right Arm)   Pulse 69   Temp 98.5 F (36.9 C) (Oral)   Resp 20   SpO2 100%   Physical Exam  Constitutional: She is oriented to person, place, and time. She appears well-developed and well-nourished.  HENT:  Head: Normocephalic and atraumatic.  Eyes: Right eye exhibits no discharge.  Cardiovascular: Normal rate, regular rhythm and normal heart sounds.  No murmur heard. Pulmonary/Chest: Effort normal and breath sounds normal. She has no wheezes. She has no rales.  Abdominal: Soft. Bowel sounds are normal. She exhibits no distension. There is tenderness.  Epigastric tenderness.  No CVA tenderness.  No abdominal tenderness otherwise.  Neurological: She is oriented to person, place, and time.  Skin: Skin is warm and dry. She is not diaphoretic.  Psychiatric: She has a normal mood and affect.  Nursing note and vitals reviewed.    ED Treatments / Results  Labs (all labs ordered are listed, but only abnormal results are displayed) Labs Reviewed  COMPREHENSIVE METABOLIC  PANEL - Abnormal; Notable for the following components:      Result Value   Glucose, Bld 105 (*)    All other components within normal limits  URINALYSIS, ROUTINE W REFLEX MICROSCOPIC - Abnormal; Notable for the following components:   Hgb urine dipstick TRACE (*)    All other components within normal limits  URINALYSIS, MICROSCOPIC (REFLEX) - Abnormal; Notable for the following components:   Bacteria, UA FEW (*)    Squamous Epithelial / LPF 0-5 (*)    All other components within normal limits  LIPASE, BLOOD  CBC  PREGNANCY, URINE    EKG  EKG Interpretation  Date/Time:  Saturday June 22 2017 23:04:30 EST Ventricular Rate:  67 PR Interval:  162 QRS Duration: 90 QT Interval:  418 QTC Calculation: 441 R Axis:   73 Text Interpretation:  Normal sinus rhythm with sinus arrhythmia Normal ECG Normal sinus rhythm Confirmed by Thomasene Lot, Nasya Vincent [phone]) on 06/23/2017 1:36:34 AM       Radiology Ct Abdomen Pelvis W Contrast  Result Date: 06/23/2017 CLINICAL DATA:  Epigastric pain for several days. Vomiting yesterday. EXAM: CT ABDOMEN AND PELVIS WITH CONTRAST TECHNIQUE: Multidetector CT imaging of the abdomen and pelvis was performed using the standard protocol following bolus administration of intravenous contrast. CONTRAST:  135m ISOVUE-300 IOPAMIDOL (ISOVUE-300) INJECTION 61% COMPARISON:  None. FINDINGS: Lower chest: Lung bases are clear. Hepatobiliary: Mild periportal edema. This can represent normal variation but can also be associated with hepatitis. No focal liver abnormality is seen. Status post cholecystectomy. No biliary dilatation. Pancreas: Unremarkable. No pancreatic ductal dilatation or surrounding inflammatory changes. Spleen: Normal in size without focal abnormality. Adrenals/Urinary Tract: Adrenal glands are unremarkable. Kidneys are normal, without renal calculi, focal lesion, or hydronephrosis. Bladder is unremarkable. Stomach/Bowel: Stomach is within normal limits. Appendix  appears normal. No evidence of bowel wall thickening, distention, or inflammatory changes. Vascular/Lymphatic: No significant vascular findings are present. No enlarged abdominal or pelvic lymph nodes. Reproductive: Uterus and bilateral adnexa are unremarkable. Small amount of free fluid in the pelvis is probably physiologic. Other: No abdominal wall hernia or abnormality. No abdominopelvic ascites. Musculoskeletal: No acute or significant osseous findings. IMPRESSION: 1. Mild periportal hepatic edema. This may represent normal variation but can be associated with hepatitis. No hepatic abscess or focal lesion. 2. No evidence of bowel obstruction or inflammation. 3. Small amount of free fluid in the pelvis is likely physiologic. Electronically Signed   By: WLucienne CapersM.D.   On: 06/23/2017 03:37    Procedures Procedures (including critical care time)  Medications Ordered in ED Medications  oxyCODONE-acetaminophen (PERCOCET/ROXICET) 5-325 MG per tablet 1 tablet (not administered)  gi cocktail (Maalox,Lidocaine,Donnatal) (30 mLs Oral Given 06/22/17 2307)  sucralfate (CARAFATE) 1 GM/10ML suspension 1 g (1 g Oral Given 06/23/17 0155)  fentaNYL (SUBLIMAZE) injection 50 mcg (50 mcg Intravenous Given 06/23/17 0154)  iopamidol (ISOVUE-300) 61 % injection  100 mL (100 mLs Intravenous Contrast Given 06/23/17 0309)     Initial Impression / Assessment and Plan / ED Course  I have reviewed the triage vital signs and the nursing notes.  Pertinent labs & imaging results that were available during my care of the patient were reviewed by me and considered in my medical decision making (see chart for details).    Patient is a 37 year old female presenting with abdominal pain.  Patient says it starts a couple days ago.  She reports that it was initially in her epigastric area.  Now she reports she has some pain in her right CVA area.  She reports she vomited one time yesterday.  No vomiting otherwise.  No diarrhea.   No fevers.  No recent change in food intake.  No alcohol use.   1:39 AM Very atypical sounding abdominal pain.  Sounds epigastric nature however she states that the GI cocktail did not work.  I am concerned about PUD versus gastritis.  However given the tenderness on exam will get CT.  Patient is pacing room because she is in so much pain.  Which is more typical for a stone however none of her other symptoms line up with kidney stone.   4:32 AM Patient's lab work all reassuring.  Patient CT showed only small amount of fluid in the perihepatic region.  Patient has normal AST and ALT so doubt hepatitis.  Patient still acting bizarrely, keeping eyes shut, constantly trying to move positions to get comfortable.  Because of this I am less concerned about an intra-abdominal issue which would cause her to want to remain still.  Patient has been eating and drinking normally, no fevers.  And reassuring labs and CT.  I do not know what further action to take.  We will treat her for peptic ulcer disease.   Final Clinical Impressions(s) / ED Diagnoses   Final diagnoses:  Generalized abdominal pain    ED Discharge Orders        Ordered    omeprazole (PRILOSEC) 20 MG capsule  Daily     06/23/17 0432    sucralfate (CARAFATE) 1 GM/10ML suspension  3 times daily with meals & bedtime     06/23/17 0432    oxyCODONE-acetaminophen (PERCOCET/ROXICET) 5-325 MG tablet  Every 6 hours PRN     06/23/17 0432       Macarthur Critchley, MD 06/23/17 [phone]

## 2017-06-23 NOTE — Discharge Instructions (Signed)
Your vital signs and physical exam are reassuring.  Your labs were all normal.  And your CT just showed a small amount of fluid around your liver.  We could not find a cause for your abdominal pain today.  It could be that you are having peptic ulcer disease.  Please take the medications provided and follow-up with your primary care physician for repeat labs and reevaluation Monday or Tuesday.

## 2017-06-24 ENCOUNTER — Encounter (HOSPITAL_COMMUNITY): Payer: Self-pay | Admitting: Emergency Medicine

## 2017-06-24 ENCOUNTER — Emergency Department (HOSPITAL_COMMUNITY): Payer: 59

## 2017-06-24 ENCOUNTER — Other Ambulatory Visit: Payer: Self-pay

## 2017-06-24 ENCOUNTER — Other Ambulatory Visit (HOSPITAL_COMMUNITY): Payer: Self-pay

## 2017-06-24 ENCOUNTER — Inpatient Hospital Stay (HOSPITAL_COMMUNITY)
Admission: EM | Admit: 2017-06-24 | Discharge: 2017-06-28 | DRG: 387 | Disposition: A | Discharge status: Home / Self Care | Payer: 59 | Attending: Internal Medicine | Admitting: Internal Medicine

## 2017-06-24 DIAGNOSIS — Z6832 Body mass index (BMI) 32.0-32.9, adult: Secondary | ICD-10-CM

## 2017-06-24 DIAGNOSIS — Z8042 Family history of malignant neoplasm of prostate: Secondary | ICD-10-CM

## 2017-06-24 DIAGNOSIS — R1011 Right upper quadrant pain: Secondary | ICD-10-CM | POA: Diagnosis not present

## 2017-06-24 DIAGNOSIS — R933 Abnormal findings on diagnostic imaging of other parts of digestive tract: Secondary | ICD-10-CM | POA: Diagnosis not present

## 2017-06-24 DIAGNOSIS — R7989 Other specified abnormal findings of blood chemistry: Secondary | ICD-10-CM

## 2017-06-24 DIAGNOSIS — E669 Obesity, unspecified: Secondary | ICD-10-CM | POA: Diagnosis present

## 2017-06-24 DIAGNOSIS — R1013 Epigastric pain: Secondary | ICD-10-CM | POA: Diagnosis not present

## 2017-06-24 DIAGNOSIS — R109 Unspecified abdominal pain: Secondary | ICD-10-CM | POA: Diagnosis present

## 2017-06-24 DIAGNOSIS — R112 Nausea with vomiting, unspecified: Secondary | ICD-10-CM | POA: Diagnosis not present

## 2017-06-24 DIAGNOSIS — Z9089 Acquired absence of other organs: Secondary | ICD-10-CM

## 2017-06-24 DIAGNOSIS — M62830 Muscle spasm of back: Secondary | ICD-10-CM | POA: Diagnosis not present

## 2017-06-24 DIAGNOSIS — Z9049 Acquired absence of other specified parts of digestive tract: Secondary | ICD-10-CM

## 2017-06-24 DIAGNOSIS — M549 Dorsalgia, unspecified: Secondary | ICD-10-CM | POA: Diagnosis not present

## 2017-06-24 DIAGNOSIS — K5000 Crohn's disease of small intestine without complications: Principal | ICD-10-CM | POA: Diagnosis present

## 2017-06-24 DIAGNOSIS — K319 Disease of stomach and duodenum, unspecified: Secondary | ICD-10-CM | POA: Diagnosis present

## 2017-06-24 DIAGNOSIS — Z8349 Family history of other endocrine, nutritional and metabolic diseases: Secondary | ICD-10-CM

## 2017-06-24 DIAGNOSIS — R932 Abnormal findings on diagnostic imaging of liver and biliary tract: Secondary | ICD-10-CM | POA: Diagnosis not present

## 2017-06-24 DIAGNOSIS — E860 Dehydration: Secondary | ICD-10-CM | POA: Diagnosis present

## 2017-06-24 DIAGNOSIS — R001 Bradycardia, unspecified: Secondary | ICD-10-CM | POA: Diagnosis not present

## 2017-06-24 DIAGNOSIS — Z885 Allergy status to narcotic agent status: Secondary | ICD-10-CM

## 2017-06-24 DIAGNOSIS — R945 Abnormal results of liver function studies: Secondary | ICD-10-CM

## 2017-06-24 HISTORY — DX: Nausea with vomiting, unspecified: R11.2

## 2017-06-24 LAB — COMPREHENSIVE METABOLIC PANEL
ALK PHOS: 44 U/L (ref 38–126)
ALT: 38 U/L (ref 14–54)
AST: 35 U/L (ref 15–41)
Albumin: 3.9 g/dL (ref 3.5–5.0)
Anion gap: 10 (ref 5–15)
BUN: 6 mg/dL (ref 6–20)
CALCIUM: 8.9 mg/dL (ref 8.9–10.3)
CO2: 24 mmol/L (ref 22–32)
CREATININE: 0.7 mg/dL (ref 0.44–1.00)
Chloride: 101 mmol/L (ref 101–111)
Glucose, Bld: 112 mg/dL — ABNORMAL HIGH (ref 65–99)
Potassium: 3.8 mmol/L (ref 3.5–5.1)
Sodium: 135 mmol/L (ref 135–145)
Total Bilirubin: 1.1 mg/dL (ref 0.3–1.2)
Total Protein: 6.6 g/dL (ref 6.5–8.1)

## 2017-06-24 LAB — CBC
HCT: 37.9 % (ref 36.0–46.0)
Hemoglobin: 12.6 g/dL (ref 12.0–15.0)
MCH: 30.2 pg (ref 26.0–34.0)
MCHC: 33.2 g/dL (ref 30.0–36.0)
MCV: 90.9 fL (ref 78.0–100.0)
PLATELETS: 300 10*3/uL (ref 150–400)
RBC: 4.17 MIL/uL (ref 3.87–5.11)
RDW: 12 % (ref 11.5–15.5)
WBC: 6 10*3/uL (ref 4.0–10.5)

## 2017-06-24 LAB — URINALYSIS, ROUTINE W REFLEX MICROSCOPIC
Bilirubin Urine: NEGATIVE
GLUCOSE, UA: NEGATIVE mg/dL
KETONES UR: 20 mg/dL — AB
Leukocytes, UA: NEGATIVE
Nitrite: NEGATIVE
PH: 6 (ref 5.0–8.0)
Protein, ur: NEGATIVE mg/dL
SPECIFIC GRAVITY, URINE: 1.009 (ref 1.005–1.030)

## 2017-06-24 LAB — I-STAT BETA HCG BLOOD, ED (MC, WL, AP ONLY): I-stat hCG, quantitative: <5 m[IU]/mL (ref <5)

## 2017-06-24 LAB — LIPASE, BLOOD: Lipase: 28 U/L (ref 11–51)

## 2017-06-24 MED ORDER — ACETAMINOPHEN 650 MG RE SUPP: 650.0000 mg | Freq: Four times a day (QID) | RECTAL | Status: DC | PRN

## 2017-06-24 MED ORDER — ONDANSETRON HCL 4 MG PO TABS: 4.0000 mg | ORAL_TABLET | Freq: Four times a day (QID) | ORAL | Status: DC | PRN

## 2017-06-24 MED ORDER — ONDANSETRON HCL 4 MG/2ML IJ SOLN
4.0000 mg | Freq: Once | INTRAMUSCULAR | Status: AC
  Administered 2017-06-24: 4 mg via INTRAVENOUS
  Filled 2017-06-24: qty 2

## 2017-06-24 MED ORDER — ONDANSETRON HCL 4 MG PO TABS
4.0000 mg | ORAL_TABLET | Freq: Four times a day (QID) | ORAL | Status: DC | PRN
  Filled 2017-06-24: qty 1

## 2017-06-24 MED ORDER — PANTOPRAZOLE SODIUM 40 MG IV SOLR
40.0000 mg | Freq: Two times a day (BID) | INTRAVENOUS | Status: DC
Start: 2017-06-24 — End: 2017-06-28
  Administered 2017-06-24 – 2017-06-28 (×8): 40 mg via INTRAVENOUS
  Filled 2017-06-24 (×8): qty 40

## 2017-06-24 MED ORDER — MORPHINE SULFATE (PF) 4 MG/ML IV SOLN
4.0000 mg | Freq: Once | INTRAVENOUS | Status: AC
  Administered 2017-06-24: 4 mg via INTRAVENOUS
  Filled 2017-06-24: qty 1

## 2017-06-24 MED ORDER — MORPHINE SULFATE (PF) 4 MG/ML IV SOLN
2.0000 mg | INTRAVENOUS | Status: DC | PRN
  Administered 2017-06-24 – 2017-06-25 (×6): 2 mg via INTRAVENOUS
  Filled 2017-06-24 (×6): qty 1

## 2017-06-24 MED ORDER — PROMETHAZINE HCL 25 MG/ML IJ SOLN
25.0000 mg | Freq: Four times a day (QID) | INTRAMUSCULAR | Status: DC | PRN
  Administered 2017-06-24 – 2017-06-26 (×8): 25 mg via INTRAVENOUS
  Filled 2017-06-24 (×8): qty 1

## 2017-06-24 MED ORDER — ACETAMINOPHEN 325 MG PO TABS: 650.0000 mg | ORAL_TABLET | Freq: Four times a day (QID) | ORAL | Status: DC | PRN

## 2017-06-24 MED ORDER — DOCUSATE SODIUM 100 MG PO CAPS
100.0000 mg | ORAL_CAPSULE | Freq: Two times a day (BID) | ORAL | Status: DC
  Administered 2017-06-24 – 2017-06-27 (×6): 100 mg via ORAL
  Filled 2017-06-24 (×8): qty 1

## 2017-06-24 MED ORDER — ONDANSETRON HCL 4 MG/2ML IJ SOLN
4.0000 mg | Freq: Four times a day (QID) | INTRAMUSCULAR | Status: DC | PRN
  Administered 2017-06-24: 4 mg via INTRAVENOUS

## 2017-06-24 MED ORDER — GADOBENATE DIMEGLUMINE 529 MG/ML IV SOLN
20.0000 mL | Freq: Once | INTRAVENOUS | Status: AC | PRN
  Administered 2017-06-24: 20 mL via INTRAVENOUS

## 2017-06-24 MED ORDER — SODIUM CHLORIDE 0.9 % IV SOLN
8.0000 mg | Freq: Four times a day (QID) | INTRAVENOUS | Status: DC | PRN
  Administered 2017-06-25 – 2017-06-26 (×3): 8 mg via INTRAVENOUS
  Filled 2017-06-24 (×6): qty 4

## 2017-06-24 MED ORDER — ONDANSETRON HCL 4 MG/2ML IJ SOLN
INTRAMUSCULAR | Status: AC
  Filled 2017-06-24: qty 2

## 2017-06-24 MED ORDER — LACTATED RINGERS IV SOLN
INTRAVENOUS | Status: DC
  Administered 2017-06-24 – 2017-06-28 (×8): via INTRAVENOUS

## 2017-06-24 MED ORDER — LORAZEPAM 2 MG/ML IJ SOLN
1.0000 mg | Freq: Once | INTRAMUSCULAR | Status: AC
  Administered 2017-06-24: 1 mg via INTRAVENOUS
  Filled 2017-06-24: qty 1

## 2017-06-24 MED ORDER — SODIUM CHLORIDE 0.9 % IV BOLUS (SEPSIS)
500.0000 mL | Freq: Once | INTRAVENOUS | Status: AC
  Administered 2017-06-24: 500 mL via INTRAVENOUS

## 2017-06-24 NOTE — H&P (Signed)
History and Physical    CARLIS BURNSWORTH IHK:[id number] DOB: July 19, 1980 DOA: 06/24/2017  PCP: Jani Gravel, MD Consultants:  None Patient coming from:  Home - lives with spouse and 2 children; NOK: spouse, [phone]  Chief Complaint: abdominal pain  HPI: Maureen Ballard is a 37 y.o. female with no significant past medical history presenting with abdominal pain.   Abdominal pain started last Wednesday "kind of dull and just escalated from there".  She was seen overnight at [facility] Saturday night.  It continued to bother her and she began vomiting more since and so she returned.  The pain is epigastric primarily with a bit of radiation into the RUQ.  She has started to have some discomfort in her back.  She has so much pain that she has to walk constantly to keep the pain at [place], but the pain medication gives her about an hour of reprieve (she is currently in that hour).  She vomited initially about 830 on Saturday night.  It "escalated to nonstop vomiting" yesterday and she vomited 10 times through the night.  No fever.  Last BM was yesterday AM and was normal.  She has tried heat, ice, biofreeze, muscle relaxants, pain pills.  GI cocktail did not help at [facility]  She took 2 NSAIDs yesterday after 2pm but she has not otherwise taken any OTC medications.  She was prescribed Prilosec and Carafate at [facility] and has not filled those medications.   ED Course:   [facility] evaluation yesterday AM - normal labs and CT.  Today with recurrent pain - [place] nonspecific.  MRCP done with ?TI involvement.  Ongoing significant pain.  GI to see - ?needs procedure.  Review of Systems: As per HPI; otherwise review of systems reviewed and negative.   Ambulatory Status:  Ambulates without assistance  Past Medical History:  Diagnosis Date  . Abnormal LFTs   . Protein, urine, abnormal presence 08/07/2013    Past Surgical History:  Procedure Laterality Date  . CHOLECYSTECTOMY  2001  . FOOT SURGERY     4 different surgeries    . FRACTURE SURGERY  2016    Left Elbow  . PILONIDAL CYST / SINUS EXCISION     twice  . SHOULDER SURGERY    . TONSILLECTOMY AND ADENOIDECTOMY      Social History   Socioeconomic History  . Marital status: Married    Spouse name: Not on file  . Number of children: Not on file  . Years of education: Not on file  . Highest education level: Not on file  Social Needs  . Financial resource strain: Not on file  . Food insecurity - worry: Not on file  . Food insecurity - inability: Not on file  . Transportation needs - medical: Not on file  . Transportation needs - non-medical: Not on file  Occupational History  . Occupation: homemaker  Tobacco Use  . Smoking status: Never Smoker  . Smokeless tobacco: Never Used  Substance and Sexual Activity  . Alcohol use: No  . Drug use: No  . Sexual activity: Not on file  Other Topics Concern  . Not on file  Social History Narrative  . Not on file    Allergies  Allergen Reactions  . Hydrocodone Nausea And Vomiting    Family History  Problem Relation Age of Onset  . Other Mother        homicide  . Prostate cancer Father   . Anorexia nervosa Sister  Prior to Admission medications   Medication Sig Start Date End Date Taking? Authorizing Provider  nitroGLYCERIN (NITRODUR - DOSED IN MG/24 HR) 0.2 mg/hr patch Place 1/4 patch to affected area daily Patient not taking: Reported on 06/24/2017 03/15/15   Stefanie Libel, MD  omeprazole (PRILOSEC) 20 MG capsule Take 1 capsule (20 mg total) by mouth daily. 06/23/17   Mackuen, Courteney Lyn, MD  oxyCODONE-acetaminophen (PERCOCET/ROXICET) 5-325 MG tablet Take 1 tablet by mouth every 6 (six) hours as needed for severe pain. Patient not taking: Reported on 06/24/2017 06/23/17   Mackuen, Courteney Lyn, MD  sucralfate (CARAFATE) 1 GM/10ML suspension Take 10 mLs (1 g total) by mouth 4 (four) times daily -  with meals and at bedtime. 06/23/17   Mackuen, Fredia Sorrow, MD    Physical Exam: Vitals:    06/24/17 1301 06/24/17 1330 06/24/17 1345 06/24/17 1400  BP: 100/77 121/84  137/71  Pulse: 80 (!) 51 (!) 45 (!) 53  Resp:      Temp:      TempSrc:      SpO2: (!) 87% 100% 100% 100%  Weight:      Height:         General:  Appears calm and comfortable and is NAD Eyes:  PERRL, EOMI, normal lids, iris ENT:  grossly normal hearing, lips & tongue, mmm; appropriate dentition Neck:  no LAD, masses or thyromegaly Cardiovascular:  RRR, no m/r/g. No LE edema.  Respiratory:   CTA bilaterally with no wheezes/rales/rhonchi.  Normal respiratory effort. Abdomen:  soft, midepigastric TTP that reproduces the pain; otherwise NT, ND, NABS Back:   normal alignment, no CVAT Skin:  no rash or induration seen on limited exam Musculoskeletal:  grossly normal tone BUE/BLE, good ROM, no bony abnormality Psychiatric:  grossly normal mood and affect, speech fluent and appropriate, AOx3 Neurologic:  CN 2-12 grossly intact, moves all extremities in coordinated fashion, sensation intact    Radiological Exams on Admission: Ct Abdomen Pelvis W Contrast  Result Date: 06/23/2017 CLINICAL DATA:  Epigastric pain for several days. Vomiting yesterday. EXAM: CT ABDOMEN AND PELVIS WITH CONTRAST TECHNIQUE: Multidetector CT imaging of the abdomen and pelvis was performed using the standard protocol following bolus administration of intravenous contrast. CONTRAST:  168m ISOVUE-300 IOPAMIDOL (ISOVUE-300) INJECTION 61% COMPARISON:  None. FINDINGS: Lower chest: Lung bases are clear. Hepatobiliary: Mild periportal edema. This can represent normal variation but can also be associated with hepatitis. No focal liver abnormality is seen. Status post cholecystectomy. No biliary dilatation. Pancreas: Unremarkable. No pancreatic ductal dilatation or surrounding inflammatory changes. Spleen: Normal in size without focal abnormality. Adrenals/Urinary Tract: Adrenal glands are unremarkable. Kidneys are normal, without renal calculi, focal  lesion, or hydronephrosis. Bladder is unremarkable. Stomach/Bowel: Stomach is within normal limits. Appendix appears normal. No evidence of bowel wall thickening, distention, or inflammatory changes. Vascular/Lymphatic: No significant vascular findings are present. No enlarged abdominal or pelvic lymph nodes. Reproductive: Uterus and bilateral adnexa are unremarkable. Small amount of free fluid in the pelvis is probably physiologic. Other: No abdominal wall hernia or abnormality. No abdominopelvic ascites. Musculoskeletal: No acute or significant osseous findings. IMPRESSION: 1. Mild periportal hepatic edema. This may represent normal variation but can be associated with hepatitis. No hepatic abscess or focal lesion. 2. No evidence of bowel obstruction or inflammation. 3. Small amount of free fluid in the pelvis is likely physiologic. Electronically Signed   By: WLucienne CapersM.D.   On: 06/23/2017 03:37   Mr 3d Recon At Scanner  Result Date: 06/24/2017  CLINICAL DATA:  Epigastric abdominal pain and vomiting. History of cholecystectomy. Nonspecific periportal edema on CT and ultrasound. EXAM: MRI ABDOMEN WITHOUT AND WITH CONTRAST (INCLUDING MRCP) TECHNIQUE: Multiplanar multisequence MR imaging of the abdomen was performed both before and after the administration of intravenous contrast. Heavily T2-weighted images of the biliary and pancreatic ducts were obtained, and three-dimensional MRCP images were rendered by post processing. CONTRAST:  40m MULTIHANCE GADOBENATE DIMEGLUMINE 529 MG/ML IV SOLN COMPARISON:  06/24/2017 abdominal sonogram. 06/23/2017 CT abdomen/ pelvis. FINDINGS: Lower chest: No acute abnormality at the lung bases. Hepatobiliary: Normal liver size and configuration. No hepatic steatosis. No liver mass. Cholecystectomy. There is mild nonspecific periportal edema throughout the liver, most prominent in the anterior right lower lobe. Bile ducts are within normal post cholecystectomy limits. Common  bile duct diameter 7 mm. No evidence of choledocholithiasis or biliary mass. Pancreas: No pancreatic mass. No pancreatic duct dilation. Pancreatic duct is diminutive and not well visualized, precluding assessment for pancreas divisum. No significant peripancreatic edema or fluid collections. Spleen: Normal size. No mass. Adrenals/Urinary Tract: Normal adrenals. No hydronephrosis. Normal kidneys with no renal mass. Stomach/Bowel: Grossly normal stomach. Normal caliber visualized small and large bowel. There is wall thickening and mucosal hyperenhancement in the terminal ileum (series 12/image 79), not appreciably changed from the CT study from 1 day prior. Vascular/Lymphatic: Normal caliber abdominal aorta. Patent portal, splenic, hepatic and renal veins. No pathologically enlarged lymph nodes in the abdomen. Other: No abdominal ascites or focal fluid collection. Musculoskeletal: No aggressive appearing focal osseous lesions. IMPRESSION: 1. Wall thickening and mucosal hyperenhancement in the terminal ileum, compatible with a nonspecific infectious or inflammatory terminal ileitis. Differential includes Crohn disease. Consider GI consultation, as clinically warranted. 2. Bile ducts are within normal post cholecystectomy limits. CBD diameter 7 mm. No choledocholithiasis. 3. Nonspecific mild periportal edema in the liver. This is commonly due to IV fluid resuscitation in the ER setting. Electronically Signed   By: JIlona SorrelM.D.   On: 06/24/2017 12:44   Dg Abdomen Acute W/chest  Result Date: 06/24/2017 CLINICAL DATA:  Epigastric pain with nausea and vomiting for the past 3 days. EXAM: DG ABDOMEN ACUTE W/ 1V CHEST COMPARISON:  CT abdomen pelvis dated June 23 2017. Chest x-ray dated March 01, 2014. FINDINGS: There is no evidence of dilated bowel loops or free intraperitoneal air. Fall contrast within the colon. No radiopaque calculi or other significant radiographic abnormality is seen. Prior cholecystectomy.  Heart size and mediastinal contours are within normal limits. Both lungs are clear. IMPRESSION: 1. Negative abdominal radiographs. 2. No acute cardiopulmonary disease. Electronically Signed   By: WTitus DubinM.D.   On: 06/24/2017 10:16   Mr Abdomen Mrcp W Wo Contast  Result Date: 06/24/2017 CLINICAL DATA:  Epigastric abdominal pain and vomiting. History of cholecystectomy. Nonspecific periportal edema on CT and ultrasound. EXAM: MRI ABDOMEN WITHOUT AND WITH CONTRAST (INCLUDING MRCP) TECHNIQUE: Multiplanar multisequence MR imaging of the abdomen was performed both before and after the administration of intravenous contrast. Heavily T2-weighted images of the biliary and pancreatic ducts were obtained, and three-dimensional MRCP images were rendered by post processing. CONTRAST:  250mMULTIHANCE GADOBENATE DIMEGLUMINE 529 MG/ML IV SOLN COMPARISON:  06/24/2017 abdominal sonogram. 06/23/2017 CT abdomen/ pelvis. FINDINGS: Lower chest: No acute abnormality at the lung bases. Hepatobiliary: Normal liver size and configuration. No hepatic steatosis. No liver mass. Cholecystectomy. There is mild nonspecific periportal edema throughout the liver, most prominent in the anterior right lower lobe. Bile ducts are within normal post cholecystectomy  limits. Common bile duct diameter 7 mm. No evidence of choledocholithiasis or biliary mass. Pancreas: No pancreatic mass. No pancreatic duct dilation. Pancreatic duct is diminutive and not well visualized, precluding assessment for pancreas divisum. No significant peripancreatic edema or fluid collections. Spleen: Normal size. No mass. Adrenals/Urinary Tract: Normal adrenals. No hydronephrosis. Normal kidneys with no renal mass. Stomach/Bowel: Grossly normal stomach. Normal caliber visualized small and large bowel. There is wall thickening and mucosal hyperenhancement in the terminal ileum (series 12/image 79), not appreciably changed from the CT study from 1 day prior.  Vascular/Lymphatic: Normal caliber abdominal aorta. Patent portal, splenic, hepatic and renal veins. No pathologically enlarged lymph nodes in the abdomen. Other: No abdominal ascites or focal fluid collection. Musculoskeletal: No aggressive appearing focal osseous lesions. IMPRESSION: 1. Wall thickening and mucosal hyperenhancement in the terminal ileum, compatible with a nonspecific infectious or inflammatory terminal ileitis. Differential includes Crohn disease. Consider GI consultation, as clinically warranted. 2. Bile ducts are within normal post cholecystectomy limits. CBD diameter 7 mm. No choledocholithiasis. 3. Nonspecific mild periportal edema in the liver. This is commonly due to IV fluid resuscitation in the ER setting. Electronically Signed   By: Ilona Sorrel M.D.   On: 06/24/2017 12:44   US Abdomen Limited Ruq  Result Date: 06/24/2017 CLINICAL DATA:  37 year old female with right upper quadrant pain and abnormal liver function tests. Post cholecystectomy. Subsequent encounter. EXAM: ULTRASOUND ABDOMEN LIMITED RIGHT UPPER QUADRANT COMPARISON:  06/23/2017 CT. FINDINGS: Gallbladder: Surgically removed. Common bile duct: Diameter: 3 mm proximally and 6.9 mm distally. Liver: Mild prominence central hepatic ducts. Mild periportal edema. Prominence of the hepatic vein confluence and inferior vena cava. Portal vein is patent on color Doppler imaging with normal direction of blood flow towards the liver. IMPRESSION: Nonspecific periportal edema. The additional finding of prominence of the hepatic veins and inferior vena cava raises possibility of increased right heart pressure (atypical for patient's age). Alternatively, periportal edema could be related to hepatitis in the proper clinical setting. Post cholecystectomy. The slight prominence of the central intrahepatic biliary ducts and distal common bile duct most likely related to post cholecystectomy state. No obstructing common bile duct stone detected.  If patient had persistent elevated liver function studies not explained by hepatitis or other abnormality then follow-up MRCP/liver MR could be obtained for further delineation if clinically desired. Electronically Signed   By: Genia Del M.D.   On: 06/24/2017 10:37    EKG: not done   Labs on Admission: I have personally reviewed the available labs and imaging studies at the time of the admission.  Pertinent labs:   UA: small Hgb, 20 ketones, many bacteria HCG negative Glucose 112 CMP otherwise WNL CBC WNL   Assessment/Plan Principal Problem:   Abdominal pain   -Patient with midepigastric abdominal pain x several days -Seen at [facility], not improved with GI cocktail; discharged with pain medication, PPI, Carafate - not picked up -She has had an extensive evaluation including labs, CT, and MRI; overall this has been very reassuring -I was called for intractable pain, which is currently controlled with pain medication -The MRI was mildly abnormal with vague nonspecific findings at the TI; this does not appear to correlate with the patient's symptoms -GI was consulted and would like to evaluate the patient in the hospital -Patient is NPO; if EGD could be accomplished today, she could potentially even be discharged today.  Otherwise, suspect that patient will be appropriate for d/c in AM. -She is very mildly dehydrated based on  her UA so will continue IVF -Will start IV PPI BID, as I suspect that this pain is related to PUD -Pain control with 2 mg IV morphine q2h prn   DVT prophylaxis: Early ambulation Code Status:  Full - confirmed with patient Family Communication: None present Disposition Plan:  Home once clinically improved Consults called: GI  Admission status: It is my clinical opinion that referral for OBSERVATION is reasonable and necessary in this patient based on the above information provided. The aforementioned taken together are felt to place the patient at high risk for  further clinical deterioration. However it is anticipated that the patient may be medically stable for discharge from the hospital within 24 to 48 hours.     Karmen Bongo MD [facility]  If note is complete, please contact covering daytime or nighttime physician. [email or web address] Password [email or web address]  06/24/2017, 2:07 PM

## 2017-06-24 NOTE — ED Notes (Signed)
Patient transported to CT 

## 2017-06-24 NOTE — Progress Notes (Signed)
Pt c/o abd pain 7/10.  Morphine 74m IV given with no relief.  MD notified.  Will continue to monitor.  [place]liezer [place]

## 2017-06-24 NOTE — ED Provider Notes (Signed)
Emergency Department Provider Note   I have reviewed the triage vital signs and the nursing notes.   HISTORY  Chief Complaint Abdominal Pain   HPI Maureen Ballard is a 37 y.o. female presents to the emergency department for evaluation of worsening epigastric abdominal pain with nausea and vomiting.  The patient estimates 10 episodes of vomiting in the past 24 hours.  Her epigastric abdominal pain is worsening since her emergency department evaluation and the scan on approximately 36 hours ago.  Patient has a history of cholecystectomy.  She denies any blood in the emesis.  No associated diarrhea.  No lower abdominal pain.  No vaginal bleeding or discharge.  No dysuria, hesitancy, urgency.  No similar pain in the past.  30 days ago she traveled to [place] but has felt fine until this episode began. Some radiation of symptoms to the back.    Past Medical History:  Diagnosis Date  . Abnormal LFTs   . Nausea & vomiting 06/24/2017  . Protein, urine, abnormal presence 08/07/2013    Patient Active Problem List   Diagnosis Date Noted  . Abdominal pain 06/24/2017  . Infrapatellar bursitis of right knee 03/15/2015  . Varicose veins of both lower extremities 03/15/2015  . Protein, urine, abnormal presence 08/07/2013    Past Surgical History:  Procedure Laterality Date  . CHOLECYSTECTOMY  2001  . FOOT SURGERY     4 different surgeries  . FRACTURE SURGERY  2016    Left Elbow  . PILONIDAL CYST / SINUS EXCISION     twice  . SHOULDER SURGERY    . TONSILLECTOMY AND ADENOIDECTOMY        Allergies Hydrocodone  Family History  Problem Relation Age of Onset  . Other Mother        homicide  . Prostate cancer Father   . Anorexia nervosa Sister     Social History Social History   Tobacco Use  . Smoking status: Never Smoker  . Smokeless tobacco: Never Used  Substance Use Topics  . Alcohol use: No  . Drug use: No    Review of Systems  Constitutional: No  fever/chills Eyes: No visual changes. ENT: No sore throat. Cardiovascular: Denies chest pain. Respiratory: Denies shortness of breath. Gastrointestinal: Positive epigastric abdominal pain. Positive nausea and vomiting.  No diarrhea.  No constipation. Genitourinary: Negative for dysuria. Musculoskeletal: Positive for back pain. Skin: Negative for rash. Neurological: Negative for headaches, focal weakness or numbness.  10-point ROS otherwise negative.  ____________________________________________   PHYSICAL EXAM:  VITAL SIGNS: ED Triage Vitals  Enc Vitals Group     BP 06/24/17 0747 122/74     Pulse Rate 06/24/17 0747 (!) 51     Resp 06/24/17 0747 16     Temp 06/24/17 0747 (!) 97.5 F (36.4 C)     Temp Source 06/24/17 0747 Oral     SpO2 06/24/17 0747 100 %     Weight 06/24/17 0748 220 lb (99.8 kg)     Height 06/24/17 0748 5' 9"  (1.753 m)     Pain Score 06/24/17 0802 8    Constitutional: Alert and oriented. Appears uncomfortable. Standing at bedside and shifting frequently.  Eyes: Conjunctivae are normal.  Head: Atraumatic. Nose: No congestion/rhinnorhea. Mouth/Throat: Mucous membranes are moist.  Oropharynx non-erythematous. Neck: No stridor.  Cardiovascular: Bradycardia. Good peripheral circulation. Grossly normal heart sounds.   Respiratory: Normal respiratory effort.  No retractions. Lungs CTAB. Gastrointestinal: Soft with focal RUQ and epigastric abdominal pain. No rebound or  guarding. Mild discomfort in the lower abdomen without focal tenderness. No distention.  Musculoskeletal: No lower extremity tenderness nor edema. No gross deformities of extremities. Neurologic:  Normal speech and language. No gross focal neurologic deficits are appreciated.  Skin:  Skin is warm, dry and intact. No rash noted.  ____________________________________________   LABS (all labs ordered are listed, but only abnormal results are displayed)  Labs Reviewed  COMPREHENSIVE METABOLIC  PANEL - Abnormal; Notable for the following components:      Result Value   Glucose, Bld 112 (*)    All other components within normal limits  URINALYSIS, ROUTINE W REFLEX MICROSCOPIC - Abnormal; Notable for the following components:   Hgb urine dipstick SMALL (*)    Ketones, ur 20 (*)    Bacteria, UA MANY (*)    Squamous Epithelial / LPF 0-5 (*)    All other components within normal limits  LIPASE, BLOOD  CBC  HIV ANTIBODY (ROUTINE TESTING)  BASIC METABOLIC PANEL  CBC  I-STAT BETA HCG BLOOD, ED ([facility], [facility], [facility] ONLY)   ____________________________________________  RADIOLOGY  Mr 3d Recon At Scanner  Result Date: 06/24/2017 CLINICAL DATA:  Epigastric abdominal pain and vomiting. History of cholecystectomy. Nonspecific periportal edema on CT and ultrasound. EXAM: MRI ABDOMEN WITHOUT AND WITH CONTRAST (INCLUDING MRCP) TECHNIQUE: Multiplanar multisequence MR imaging of the abdomen was performed both before and after the administration of intravenous contrast. Heavily T2-weighted images of the biliary and pancreatic ducts were obtained, and three-dimensional MRCP images were rendered by post processing. CONTRAST:  38m MULTIHANCE GADOBENATE DIMEGLUMINE 529 MG/ML IV SOLN COMPARISON:  06/24/2017 abdominal sonogram. 06/23/2017 CT abdomen/ pelvis. FINDINGS: Lower chest: No acute abnormality at the lung bases. Hepatobiliary: Normal liver size and configuration. No hepatic steatosis. No liver mass. Cholecystectomy. There is mild nonspecific periportal edema throughout the liver, most prominent in the anterior right lower lobe. Bile ducts are within normal post cholecystectomy limits. Common bile duct diameter 7 mm. No evidence of choledocholithiasis or biliary mass. Pancreas: No pancreatic mass. No pancreatic duct dilation. Pancreatic duct is diminutive and not well visualized, precluding assessment for pancreas divisum. No significant peripancreatic edema or fluid collections. Spleen: Normal size. No mass.  Adrenals/Urinary Tract: Normal adrenals. No hydronephrosis. Normal kidneys with no renal mass. Stomach/Bowel: Grossly normal stomach. Normal caliber visualized small and large bowel. There is wall thickening and mucosal hyperenhancement in the terminal ileum (series 12/image 79), not appreciably changed from the CT study from 1 day prior. Vascular/Lymphatic: Normal caliber abdominal aorta. Patent portal, splenic, hepatic and renal veins. No pathologically enlarged lymph nodes in the abdomen. Other: No abdominal ascites or focal fluid collection. Musculoskeletal: No aggressive appearing focal osseous lesions. IMPRESSION: 1. Wall thickening and mucosal hyperenhancement in the terminal ileum, compatible with a nonspecific infectious or inflammatory terminal ileitis. Differential includes Crohn disease. Consider GI consultation, as clinically warranted. 2. Bile ducts are within normal post cholecystectomy limits. CBD diameter 7 mm. No choledocholithiasis. 3. Nonspecific mild periportal edema in the liver. This is commonly due to IV fluid resuscitation in the ER setting. Electronically Signed   By: JIlona SorrelM.D.   On: 06/24/2017 12:44   Dg Abdomen Acute W/chest  Result Date: 06/24/2017 CLINICAL DATA:  Epigastric pain with nausea and vomiting for the past 3 days. EXAM: DG ABDOMEN ACUTE W/ 1V CHEST COMPARISON:  CT abdomen pelvis dated June 23 2017. Chest x-ray dated March 01, 2014. FINDINGS: There is no evidence of dilated bowel loops or free intraperitoneal air. Fall contrast within  the colon. No radiopaque calculi or other significant radiographic abnormality is seen. Prior cholecystectomy. Heart size and mediastinal contours are within normal limits. Both lungs are clear. IMPRESSION: 1. Negative abdominal radiographs. 2. No acute cardiopulmonary disease. Electronically Signed   By: Titus Dubin M.D.   On: 06/24/2017 10:16   Mr Abdomen Mrcp W Wo Contast  Result Date: 06/24/2017 CLINICAL DATA:   Epigastric abdominal pain and vomiting. History of cholecystectomy. Nonspecific periportal edema on CT and ultrasound. EXAM: MRI ABDOMEN WITHOUT AND WITH CONTRAST (INCLUDING MRCP) TECHNIQUE: Multiplanar multisequence MR imaging of the abdomen was performed both before and after the administration of intravenous contrast. Heavily T2-weighted images of the biliary and pancreatic ducts were obtained, and three-dimensional MRCP images were rendered by post processing. CONTRAST:  28m MULTIHANCE GADOBENATE DIMEGLUMINE 529 MG/ML IV SOLN COMPARISON:  06/24/2017 abdominal sonogram. 06/23/2017 CT abdomen/ pelvis. FINDINGS: Lower chest: No acute abnormality at the lung bases. Hepatobiliary: Normal liver size and configuration. No hepatic steatosis. No liver mass. Cholecystectomy. There is mild nonspecific periportal edema throughout the liver, most prominent in the anterior right lower lobe. Bile ducts are within normal post cholecystectomy limits. Common bile duct diameter 7 mm. No evidence of choledocholithiasis or biliary mass. Pancreas: No pancreatic mass. No pancreatic duct dilation. Pancreatic duct is diminutive and not well visualized, precluding assessment for pancreas divisum. No significant peripancreatic edema or fluid collections. Spleen: Normal size. No mass. Adrenals/Urinary Tract: Normal adrenals. No hydronephrosis. Normal kidneys with no renal mass. Stomach/Bowel: Grossly normal stomach. Normal caliber visualized small and large bowel. There is wall thickening and mucosal hyperenhancement in the terminal ileum (series 12/image 79), not appreciably changed from the CT study from 1 day prior. Vascular/Lymphatic: Normal caliber abdominal aorta. Patent portal, splenic, hepatic and renal veins. No pathologically enlarged lymph nodes in the abdomen. Other: No abdominal ascites or focal fluid collection. Musculoskeletal: No aggressive appearing focal osseous lesions. IMPRESSION: 1. Wall thickening and mucosal  hyperenhancement in the terminal ileum, compatible with a nonspecific infectious or inflammatory terminal ileitis. Differential includes Crohn disease. Consider GI consultation, as clinically warranted. 2. Bile ducts are within normal post cholecystectomy limits. CBD diameter 7 mm. No choledocholithiasis. 3. Nonspecific mild periportal edema in the liver. This is commonly due to IV fluid resuscitation in the ER setting. Electronically Signed   By: JIlona SorrelM.D.   On: 06/24/2017 12:44   [place] Limited Ruq  Result Date: 06/24/2017 CLINICAL DATA:  37year old female with right upper quadrant pain and abnormal liver function tests. Post cholecystectomy. Subsequent encounter. EXAM: ULTRASOUND ABDOMEN LIMITED RIGHT UPPER QUADRANT COMPARISON:  06/23/2017 CT. FINDINGS: Gallbladder: Surgically removed. Common bile duct: Diameter: 3 mm proximally and 6.9 mm distally. Liver: Mild prominence central hepatic ducts. Mild periportal edema. Prominence of the hepatic vein confluence and inferior vena cava. Portal vein is patent on color Doppler imaging with normal direction of blood flow towards the liver. IMPRESSION: Nonspecific periportal edema. The additional finding of prominence of the hepatic veins and inferior vena cava raises possibility of increased right heart pressure (atypical for patient's age). Alternatively, periportal edema could be related to hepatitis in the proper clinical setting. Post cholecystectomy. The slight prominence of the central intrahepatic biliary ducts and distal common bile duct most likely related to post cholecystectomy state. No obstructing common bile duct stone detected. If patient had persistent elevated liver function studies not explained by hepatitis or other abnormality then follow-up MRCP/liver MR could be obtained for further delineation if clinically desired. Electronically Signed  By: Genia Del M.D.   On: 06/24/2017 10:37     ____________________________________________   PROCEDURES  Procedure(s) performed:   Procedures  None ____________________________________________   INITIAL IMPRESSION / ASSESSMENT AND PLAN / ED COURSE  Pertinent labs & imaging results that were available during my care of the patient were reviewed by me and considered in my medical decision making (see chart for details).  Patient presents to the emergency department with continued epigastric and right upper quadrant abdominal pain with worsening nausea and vomiting.  Patient was evaluated in the emergency department approximately 36 hours ago with CT scan showing some nonspecific inflammation near the liver and likely physiologic free fluid in the pelvis.  Liver enzymes and bilirubin are within normal limits here.  Patient does have small amount of blood in the urine as well as many bacteria but no other symptoms or lab findings to suggest UTI.  Plan for plain film of the abdomen to evaluate for possible obstruction to explain her increase in vomiting.  Patient has had a cholecystectomy but plan to obtain right upper quadrant ultrasound to evaluate her bile duct and consider possible cholelithiasis.  10:44 AM [place] results discussed with the patient. Continues to have pain but it is decreased. Plan to discuss utility of MRCP with GI given continued symptoms and US findings. Normal T. Bili and LFTs.   11:04 AM Spoke with Dr. Collene Mares with GI. Reviewed the [place] results and history by phone. Recommends MRCP and call with discussion of results.   01:22 PM Reviewed MRCP results with Dr. Collene Mares with GI. She will see in consult today to advise on additional w/u. Pain is difficult to control here in the ED. Patient receiving 3rd dose of Morphine and still looks uncomfortable. Plan for admission for pain control and continued w/u with GI. Patient's PCP is [name].   Discussed patient's case with Hospitalist, Dr. Lorin Mercy to request admission. Patient  and family (if present) updated with plan. Care transferred to Hospitalist service.  I reviewed all nursing notes, vitals, pertinent old records, EKGs, labs, imaging (as available).  ____________________________________________  FINAL CLINICAL IMPRESSION(S) / ED DIAGNOSES  Final diagnoses:  RUQ abdominal pain  Non-intractable vomiting with nausea, unspecified vomiting type     MEDICATIONS GIVEN DURING THIS VISIT:  Medications  acetaminophen (TYLENOL) tablet 650 mg (not administered)    Or  acetaminophen (TYLENOL) suppository 650 mg (not administered)  docusate sodium (COLACE) capsule 100 mg (not administered)  lactated ringers infusion ( Intravenous New Bag/Given 06/24/17 1511)  pantoprazole (PROTONIX) injection 40 mg (not administered)  morphine 4 MG/ML injection 2 mg (2 mg Intravenous Given 06/24/17 1829)  ondansetron (ZOFRAN) tablet 4 mg (not administered)    Or  ondansetron (ZOFRAN) 8 mg in sodium chloride 0.9 % 50 mL IVPB (not administered)  promethazine (PHENERGAN) injection 25 mg (25 mg Intravenous Given 06/24/17 1829)  sodium chloride 0.9 % bolus 500 mL (0 mLs Intravenous Stopped 06/24/17 1123)  morphine 4 MG/ML injection 4 mg (4 mg Intravenous Given 06/24/17 0930)  ondansetron (ZOFRAN) injection 4 mg (4 mg Intravenous Given 06/24/17 0930)  morphine 4 MG/ML injection 4 mg (4 mg Intravenous Given 06/24/17 1121)  gadobenate dimeglumine (MULTIHANCE) injection 20 mL (20 mLs Intravenous Contrast Given 06/24/17 1216)  morphine 4 MG/ML injection 4 mg (4 mg Intravenous Given 06/24/17 1324)  ondansetron (ZOFRAN) injection 4 mg (4 mg Intravenous Given 06/24/17 1332)  LORazepam (ATIVAN) injection 1 mg (1 mg Intravenous Given 06/24/17 1604)  ondansetron (ZOFRAN) 4 MG/2ML  injection (  Duplicate 11/17/82 8592)    Note:  This document was prepared using Dragon voice recognition software and may include unintentional dictation errors.  Nanda Quinton, MD Emergency Medicine   Latima Hamza, [facility], MD 06/24/17  [phone]

## 2017-06-24 NOTE — ED Triage Notes (Signed)
Pt states she has been having epigastric pain since Wednesday and seen had [facility]. Pt states she has vomited 10 times in the last 24 hours.

## 2017-06-24 NOTE — ED Notes (Signed)
Patient transported to X-ray and [place]

## 2017-06-24 NOTE — H&P (View-Only) (Signed)
UNASSIGNED PATIENT Reason for Consult: Severe abdominal pain. Referring Physician: THP  [name] is an 37 y.o. female.  HPI: Maureen Ballard is a 37 year old white female, who was in her usual state of health till about 4 days ago when she developed epigastric pain and mild reflux. Patient seems to be having severe abdominal pain and is unable to give me much of the details on her history and therefore most the details have been procured from my conversation with her husband, who is at the bedside. Patient initially tried some Rolaids to help with epigastric pain but that did not help her symptoms. She claims her pain became progressively severe over the last 4 days and became unbearable she went came to the emergency room at at the [facility] ER on [facility] when she was evaluated and sent home after the CT scan of t revealed mild periportal hepatic edema, small amount of free fluid in the pelvis and no evidence of bowel obstruction or inflammation. She came back to the emergency room as her symptoms worsened and had normal abdominal series followed by an abdominal ultrasound that showed mild nonspecific periportal edema with prominence of the hepatic veins and inferior vena cava and slightly dilated intrahepatic ducts and distal common bile duct; no obstructing common bile duct stone was made noted and the gallbladder was surgically absent.  A follow-up MRI was done as her symptoms could not be explained with the above tests and on the MRI she was noted to have wall thickening and mucosal hyperenhancement in the terminal ileum question infectious versus inflammatory slightly but dilated bile duct at 7 mm consistent with post cholecystomy cystectomy status with no evidence of choledocholithiasis. Again nonspecific mild periportal edema was noted  the patient denies any use of alcohol or drugs. She was healthy at her baseline when her symptoms started 4 days ago. She denies a previous history of  pancreatitis. She's had an ulcer in college but could not tell me if she had H. pylori infection or not She takes occasional nonsteroidals but not on a daily basis. She has  had multiple orthopedic surgeries and has taken Dilaudid in the past. She has regular bowel movements with no Heberden's of melena or hematochezia. There is no history of nephrolithiasis. She denies a previous history of acid reflux dysphagia odynophagia. Prior to the onset of her symptoms fairly good appetite and her weight was weight had been stable. Her abdominal pain was accompanied with nausea and vomiting that has been refractory in the last 24 hours prompting her to come to the emergency room. She denies having any coffee-ground emesis or hematemesis   Past Medical History:  Diagnosis Date  . Abnormal LFTs   . Protein, urine, abnormal presence 08/07/2013   Past Surgical History:  Procedure Laterality Date  . CHOLECYSTECTOMY  2001  . FOOT SURGERY     4 different surgeries  . FRACTURE SURGERY  2016    Left Elbow  . PILONIDAL CYST / SINUS EXCISION     twice  . SHOULDER SURGERY    . TONSILLECTOMY AND ADENOIDECTOMY     Family History  Problem Relation Age of Onset  . Other Mother        homicide  . Prostate cancer Father   . Anorexia nervosa Sister    Social History:  reports that  has never smoked. she has never used smokeless tobacco. She reports that she does not drink alcohol or use drugs. She is a homemaker,  Allergies:  Allergies  Allergen Reactions  . Hydrocodone Nausea And Vomiting   Medications: I have reviewed the patient's current medications.  Results for orders placed or performed during the hospital encounter of 06/24/17 (from the past 48 hour(s))  Lipase, blood     Status: None   Collection Time: 06/24/17  8:00 AM  Result Value Ref Range   Lipase 28 11 - 51 U/L  Comprehensive metabolic panel     Status: Abnormal   Collection Time: 06/24/17  8:00 AM  Result Value Ref Range   Sodium 135  135 - 145 mmol/L   Potassium 3.8 3.5 - 5.1 mmol/L   Chloride 101 101 - 111 mmol/L   CO2 24 22 - 32 mmol/L   Glucose, Bld 112 (H) 65 - 99 mg/dL   BUN 6 6 - 20 mg/dL   Creatinine, Ser 0.70 0.44 - 1.00 mg/dL   Calcium 8.9 8.9 - 10.3 mg/dL   Total Protein 6.6 6.5 - 8.1 g/dL   Albumin 3.9 3.5 - 5.0 g/dL   AST 35 15 - 41 U/L   ALT 38 14 - 54 U/L   Alkaline Phosphatase 44 38 - 126 U/L   Total Bilirubin 1.1 0.3 - 1.2 mg/dL   GFR calc non Af Amer >60 >60 mL/min   GFR calc Af Amer >60 >60 mL/min    Comment: (NOTE) The eGFR has been calculated using the CKD EPI equation. This calculation has not been validated in all clinical situations. eGFR's persistently <60 mL/min signify possible Chronic Kidney Disease.    Anion gap 10 5 - 15  CBC     Status: None   Collection Time: 06/24/17  8:00 AM  Result Value Ref Range   WBC 6.0 4.0 - 10.5 K/uL   RBC 4.17 3.87 - 5.11 MIL/uL   Hemoglobin 12.6 12.0 - 15.0 g/dL   HCT 37.9 36.0 - 46.0 %   MCV 90.9 78.0 - 100.0 fL   MCH 30.2 26.0 - 34.0 pg   MCHC 33.2 30.0 - 36.0 g/dL   RDW 12.0 11.5 - 15.5 %   Platelets 300 150 - 400 K/uL  Urinalysis, Routine w reflex microscopic     Status: Abnormal   Collection Time: 06/24/17  8:04 AM  Result Value Ref Range   Color, Urine YELLOW YELLOW   APPearance CLEAR CLEAR   Specific Gravity, Urine 1.009 1.005 - 1.030   pH 6.0 5.0 - 8.0   Glucose, UA NEGATIVE NEGATIVE mg/dL   Hgb urine dipstick SMALL (A) NEGATIVE   Bilirubin Urine NEGATIVE NEGATIVE   Ketones, ur 20 (A) NEGATIVE mg/dL   Protein, ur NEGATIVE NEGATIVE mg/dL   Nitrite NEGATIVE NEGATIVE   Leukocytes, UA NEGATIVE NEGATIVE   RBC / HPF 0-5 0 - 5 RBC/hpf   WBC, UA 0-5 0 - 5 WBC/hpf   Bacteria, UA MANY (A) NONE SEEN   Squamous Epithelial / LPF 0-5 (A) NONE SEEN   Mucus PRESENT   I-Stat beta hCG blood, ED     Status: None   Collection Time: 06/24/17  8:08 AM  Result Value Ref Range   I-stat hCG, quantitative <5.0 <5 mIU/mL   Comment 3             Comment:   GEST. AGE      CONC.  (mIU/mL)   <=1 WEEK        5 - 50     2 WEEKS       50 - 500       3 WEEKS       100 - 10,000     4 WEEKS     1,000 - 30,000        FEMALE AND NON-PREGNANT FEMALE:     LESS THAN 5 mIU/mL     Ct Abdomen Pelvis W Contrast  Result Date: 06/23/2017 CLINICAL DATA:  Epigastric pain for several days. Vomiting yesterday. EXAM: CT ABDOMEN AND PELVIS WITH CONTRAST TECHNIQUE: Multidetector CT imaging of the abdomen and pelvis was performed using the standard protocol following bolus administration of intravenous contrast. CONTRAST:  142m ISOVUE-300 IOPAMIDOL (ISOVUE-300) INJECTION 61% COMPARISON:  None. FINDINGS: Lower chest: Lung bases are clear. Hepatobiliary: Mild periportal edema. This can represent normal variation but can also be associated with hepatitis. No focal liver abnormality is seen. Status post cholecystectomy. No biliary dilatation. Pancreas: Unremarkable. No pancreatic ductal dilatation or surrounding inflammatory changes. Spleen: Normal in size without focal abnormality. Adrenals/Urinary Tract: Adrenal glands are unremarkable. Kidneys are normal, without renal calculi, focal lesion, or hydronephrosis. Bladder is unremarkable. Stomach/Bowel: Stomach is within normal limits. Appendix appears normal. No evidence of bowel wall thickening, distention, or inflammatory changes. Vascular/Lymphatic: No significant vascular findings are present. No enlarged abdominal or pelvic lymph nodes. Reproductive: Uterus and bilateral adnexa are unremarkable. Small amount of free fluid in the pelvis is probably physiologic. Other: No abdominal wall hernia or abnormality. No abdominopelvic ascites. Musculoskeletal: No acute or significant osseous findings. IMPRESSION: 1. Mild periportal hepatic edema. This may represent normal variation but can be associated with hepatitis. No hepatic abscess or focal lesion. 2. No evidence of bowel obstruction or inflammation. 3. Small amount of free  fluid in the pelvis is likely physiologic. Electronically Signed   By: WLucienne CapersM.D.   On: 06/23/2017 03:37   Mr 3d Recon At Scanner  Result Date: 06/24/2017 CLINICAL DATA:  Epigastric abdominal pain and vomiting. History of cholecystectomy. Nonspecific periportal edema on CT and ultrasound. EXAM: MRI ABDOMEN WITHOUT AND WITH CONTRAST (INCLUDING MRCP) TECHNIQUE: Multiplanar multisequence MR imaging of the abdomen was performed both before and after the administration of intravenous contrast. Heavily T2-weighted images of the biliary and pancreatic ducts were obtained, and three-dimensional MRCP images were rendered by post processing. CONTRAST:  26mMULTIHANCE GADOBENATE DIMEGLUMINE 529 MG/ML IV SOLN COMPARISON:  06/24/2017 abdominal sonogram. 06/23/2017 CT abdomen/ pelvis. FINDINGS: Lower chest: No acute abnormality at the lung bases. Hepatobiliary: Normal liver size and configuration. No hepatic steatosis. No liver mass. Cholecystectomy. There is mild nonspecific periportal edema throughout the liver, most prominent in the anterior right lower lobe. Bile ducts are within normal post cholecystectomy limits. Common bile duct diameter 7 mm. No evidence of choledocholithiasis or biliary mass. Pancreas: No pancreatic mass. No pancreatic duct dilation. Pancreatic duct is diminutive and not well visualized, precluding assessment for pancreas divisum. No significant peripancreatic edema or fluid collections. Spleen: Normal size. No mass. Adrenals/Urinary Tract: Normal adrenals. No hydronephrosis. Normal kidneys with no renal mass. Stomach/Bowel: Grossly normal stomach. Normal caliber visualized small and large bowel. There is wall thickening and mucosal hyperenhancement in the terminal ileum (series 12/image 79), not appreciably changed from the CT study from 1 day prior. Vascular/Lymphatic: Normal caliber abdominal aorta. Patent portal, splenic, hepatic and renal veins. No pathologically enlarged lymph nodes  in the abdomen. Other: No abdominal ascites or focal fluid collection. Musculoskeletal: No aggressive appearing focal osseous lesions. IMPRESSION: 1. Wall thickening and mucosal hyperenhancement in the terminal ileum, compatible with a nonspecific infectious or inflammatory terminal ileitis. Differential includes Crohn disease.  Consider GI consultation, as clinically warranted. 2. Bile ducts are within normal post cholecystectomy limits. CBD diameter 7 mm. No choledocholithiasis. 3. Nonspecific mild periportal edema in the liver. This is commonly due to IV fluid resuscitation in the ER setting. Electronically Signed   By: Ilona Sorrel M.D.   On: 06/24/2017 12:44   Dg Abdomen Acute W/chest  Result Date: 06/24/2017 CLINICAL DATA:  Epigastric pain with nausea and vomiting for the past 3 days. EXAM: DG ABDOMEN ACUTE W/ 1V CHEST COMPARISON:  CT abdomen pelvis dated June 23 2017. Chest x-ray dated March 01, 2014. FINDINGS: There is no evidence of dilated bowel loops or free intraperitoneal air. Fall contrast within the colon. No radiopaque calculi or other significant radiographic abnormality is seen. Prior cholecystectomy. Heart size and mediastinal contours are within normal limits. Both lungs are clear. IMPRESSION: 1. Negative abdominal radiographs. 2. No acute cardiopulmonary disease. Electronically Signed   By: Titus Dubin M.D.   On: 06/24/2017 10:16   Mr Abdomen Mrcp W Wo Contast  Result Date: 06/24/2017 CLINICAL DATA:  Epigastric abdominal pain and vomiting. History of cholecystectomy. Nonspecific periportal edema on CT and ultrasound. EXAM: MRI ABDOMEN WITHOUT AND WITH CONTRAST (INCLUDING MRCP) TECHNIQUE: Multiplanar multisequence MR imaging of the abdomen was performed both before and after the administration of intravenous contrast. Heavily T2-weighted images of the biliary and pancreatic ducts were obtained, and three-dimensional MRCP images were rendered by post processing. CONTRAST:  67m  MULTIHANCE GADOBENATE DIMEGLUMINE 529 MG/ML IV SOLN COMPARISON:  06/24/2017 abdominal sonogram. 06/23/2017 CT abdomen/ pelvis. FINDINGS: Lower chest: No acute abnormality at the lung bases. Hepatobiliary: Normal liver size and configuration. No hepatic steatosis. No liver mass. Cholecystectomy. There is mild nonspecific periportal edema throughout the liver, most prominent in the anterior right lower lobe. Bile ducts are within normal post cholecystectomy limits. Common bile duct diameter 7 mm. No evidence of choledocholithiasis or biliary mass. Pancreas: No pancreatic mass. No pancreatic duct dilation. Pancreatic duct is diminutive and not well visualized, precluding assessment for pancreas divisum. No significant peripancreatic edema or fluid collections. Spleen: Normal size. No mass. Adrenals/Urinary Tract: Normal adrenals. No hydronephrosis. Normal kidneys with no renal mass. Stomach/Bowel: Grossly normal stomach. Normal caliber visualized small and large bowel. There is wall thickening and mucosal hyperenhancement in the terminal ileum (series 12/image 79), not appreciably changed from the CT study from 1 day prior. Vascular/Lymphatic: Normal caliber abdominal aorta. Patent portal, splenic, hepatic and renal veins. No pathologically enlarged lymph nodes in the abdomen. Other: No abdominal ascites or focal fluid collection. Musculoskeletal: No aggressive appearing focal osseous lesions. IMPRESSION: 1. Wall thickening and mucosal hyperenhancement in the terminal ileum, compatible with a nonspecific infectious or inflammatory terminal ileitis. Differential includes Crohn disease. Consider GI consultation, as clinically warranted. 2. Bile ducts are within normal post cholecystectomy limits. CBD diameter 7 mm. No choledocholithiasis. 3. Nonspecific mild periportal edema in the liver. This is commonly due to IV fluid resuscitation in the ER setting. Electronically Signed   By: JIlona SorrelM.D.   On: 06/24/2017 12:44    UKoreaAbdomen Limited Ruq  Result Date: 06/24/2017 CLINICAL DATA:  37year old female with right upper quadrant pain and abnormal liver function tests. Post cholecystectomy. Subsequent encounter. EXAM: ULTRASOUND ABDOMEN LIMITED RIGHT UPPER QUADRANT COMPARISON:  06/23/2017 CT. FINDINGS: Gallbladder: Surgically removed. Common bile duct: Diameter: 3 mm proximally and 6.9 mm distally. Liver: Mild prominence central hepatic ducts. Mild periportal edema. Prominence of the hepatic vein confluence and inferior vena cava. Portal vein is  patent on color Doppler imaging with normal direction of blood flow towards the liver. IMPRESSION: Nonspecific periportal edema. The additional finding of prominence of the hepatic veins and inferior vena cava raises possibility of increased right heart pressure (atypical for patient's age). Alternatively, periportal edema could be related to hepatitis in the proper clinical setting. Post cholecystectomy. The slight prominence of the central intrahepatic biliary ducts and distal common bile duct most likely related to post cholecystectomy state. No obstructing common bile duct stone detected. If patient had persistent elevated liver function studies not explained by hepatitis or other abnormality then follow-up MRCP/liver MR could be obtained for further delineation if clinically desired. Electronically Signed   By: Genia Del M.D.   On: 06/24/2017 10:37   Review of Systems  Constitutional: Negative.   HENT: Negative.   Eyes: Negative.   Respiratory: Negative.   Cardiovascular: Negative.   Gastrointestinal: Positive for abdominal pain, nausea and vomiting. Negative for blood in stool, constipation, diarrhea and melena.  Genitourinary: Negative.   Musculoskeletal: Positive for back pain. Negative for falls, joint pain, myalgias and neck pain.  Skin: Negative.   Neurological: Negative.   Endo/Heme/Allergies: Negative.   Psychiatric/Behavioral: Negative.    Blood pressure  137/71, pulse (!) 53, temperature (!) 97.5 F (36.4 C), temperature source Oral, resp. rate 16, height _0  (1.753 m), weight 99.8 kg (220 lb), SpO2 100 %. Physical Exam  Constitutional: She is oriented to person, place, and time. She appears well-developed and well-nourished.  HENT:  Head: Normocephalic and atraumatic.  Eyes: Conjunctivae and EOM are normal. Pupils are equal, round, and reactive to light.  Neck: Normal range of motion. Neck supple.  Cardiovascular: Normal rate.  Respiratory: Effort normal and breath sounds normal.  GI: Soft. Bowel sounds are normal. She exhibits no mass. There is tenderness. There is guarding. There is no rebound.  Musculoskeletal: Normal range of motion.  Neurological: She is alert and oriented to person, place, and time.  Skin: Skin is warm and dry.  Psychiatric: She has a normal mood and affect. Her behavior is normal. Judgment and thought content normal.   Assessment/Plan: 1) Severe epigastric pain with nausea and vomiting-with normal LFTs and CBC. Reason for her symptoms are not clear to me EGD is planned for tomorrow to rule out peptic ulcer disease will treat symptomatically for now with Zofran and PPIs. Her lipase is normal and there is no evidence of choledocholithiasis on the MRI. She has been advised to avoid all nonsteroidals for now.  2)  Abnormal MRI with mucosal hyperenhancement of the terminal ileum. Patient does not have a fever or elevated white count therefore infection seems less likely she might need a colonoscopy to evaluate this abnormality but as she is having severe nausea and vomiting at this time a colonoscopy will be postponed to her acute symptoms improve.  Jaryiah Mehlman 06/24/2017, 3:28 PM

## 2017-06-24 NOTE — Consult Note (Signed)
UNASSIGNED PATIENT Reason for Consult: Severe abdominal pain. Referring Physician: THP  Maureen Ballard is an 37 y.o. female.  HPI: Maureen Ballard is a 37 year old white female, who was in her usual state of health till about 4 days ago when she developed epigastric pain and mild reflux. Patient seems to be having severe abdominal pain and is unable to give me much of the details on her history and therefore most the details have been procured from my conversation with her husband, who is at the bedside. Patient initially tried some Rolaids to help with epigastric pain but that did not help her symptoms. She claims her pain became progressively severe over the last 4 days and became unbearable she went came to the emergency room at at the [facility] ER on [facility] when she was evaluated and sent home after the CT scan of t revealed mild periportal hepatic edema, small amount of free fluid in the pelvis and no evidence of bowel obstruction or inflammation. She came back to the emergency room as her symptoms worsened and had normal abdominal series followed by an abdominal ultrasound that showed mild nonspecific periportal edema with prominence of the hepatic veins and inferior vena cava and slightly dilated intrahepatic ducts and distal common bile duct; no obstructing common bile duct stone was made noted and the gallbladder was surgically absent.  A follow-up MRI was done as her symptoms could not be explained with the above tests and on the MRI she was noted to have wall thickening and mucosal hyperenhancement in the terminal ileum question infectious versus inflammatory slightly but dilated bile duct at 7 mm consistent with post cholecystomy cystectomy status with no evidence of choledocholithiasis. Again nonspecific mild periportal edema was noted  the patient denies any use of alcohol or drugs. She was healthy at her baseline when her symptoms started 4 days ago. She denies a previous history of  pancreatitis. She's had an ulcer in college but could not tell me if she had H. pylori infection or not She takes occasional nonsteroidals but not on a daily basis. She has  had multiple orthopedic surgeries and has taken Dilaudid in the past. She has regular bowel movements with no Heberden's of melena or hematochezia. There is no history of nephrolithiasis. She denies a previous history of acid reflux dysphagia odynophagia. Prior to the onset of her symptoms fairly good appetite and her weight was weight had been stable. Her abdominal pain was accompanied with nausea and vomiting that has been refractory in the last 24 hours prompting her to come to the emergency room. She denies having any coffee-ground emesis or hematemesis   Past Medical History:  Diagnosis Date  . Abnormal LFTs   . Protein, urine, abnormal presence 08/07/2013   Past Surgical History:  Procedure Laterality Date  . CHOLECYSTECTOMY  2001  . FOOT SURGERY     4 different surgeries  . FRACTURE SURGERY  2016    Left Elbow  . PILONIDAL CYST / SINUS EXCISION     twice  . SHOULDER SURGERY    . TONSILLECTOMY AND ADENOIDECTOMY     Family History  Problem Relation Age of Onset  . Other Mother        homicide  . Prostate cancer Father   . Anorexia nervosa Sister    Social History:  reports that  has never smoked. she has never used smokeless tobacco. She reports that she does not drink alcohol or use drugs. She is a homemaker,  Allergies:  Allergies  Allergen Reactions  . Hydrocodone Nausea And Vomiting   Medications: I have reviewed the patient's current medications.  Results for orders placed or performed during the hospital encounter of 06/24/17 (from the past 48 hour(s))  Lipase, blood     Status: None   Collection Time: 06/24/17  8:00 AM  Result Value Ref Range   Lipase 28 11 - 51 U/L  Comprehensive metabolic panel     Status: Abnormal   Collection Time: 06/24/17  8:00 AM  Result Value Ref Range   Sodium 135  135 - 145 mmol/L   Potassium 3.8 3.5 - 5.1 mmol/L   Chloride 101 101 - 111 mmol/L   CO2 24 22 - 32 mmol/L   Glucose, Bld 112 (H) 65 - 99 mg/dL   BUN 6 6 - 20 mg/dL   Creatinine, Ser 0.70 0.44 - 1.00 mg/dL   Calcium 8.9 8.9 - 10.3 mg/dL   Total Protein 6.6 6.5 - 8.1 g/dL   Albumin 3.9 3.5 - 5.0 g/dL   AST 35 15 - 41 U/L   ALT 38 14 - 54 U/L   Alkaline Phosphatase 44 38 - 126 U/L   Total Bilirubin 1.1 0.3 - 1.2 mg/dL   GFR calc non Af Amer >60 >60 mL/min   GFR calc Af Amer >60 >60 mL/min    Comment: (NOTE) The eGFR has been calculated using the CKD EPI equation. This calculation has not been validated in all clinical situations. eGFR's persistently <60 mL/min signify possible Chronic Kidney Disease.    Anion gap 10 5 - 15  CBC     Status: None   Collection Time: 06/24/17  8:00 AM  Result Value Ref Range   WBC 6.0 4.0 - 10.5 K/uL   RBC 4.17 3.87 - 5.11 MIL/uL   Hemoglobin 12.6 12.0 - 15.0 g/dL   HCT 37.9 36.0 - 46.0 %   MCV 90.9 78.0 - 100.0 fL   MCH 30.2 26.0 - 34.0 pg   MCHC 33.2 30.0 - 36.0 g/dL   RDW 12.0 11.5 - 15.5 %   Platelets 300 150 - 400 K/uL  Urinalysis, Routine w reflex microscopic     Status: Abnormal   Collection Time: 06/24/17  8:04 AM  Result Value Ref Range   Color, Urine YELLOW YELLOW   APPearance CLEAR CLEAR   Specific Gravity, Urine 1.009 1.005 - 1.030   pH 6.0 5.0 - 8.0   Glucose, UA NEGATIVE NEGATIVE mg/dL   Hgb urine dipstick SMALL (A) NEGATIVE   Bilirubin Urine NEGATIVE NEGATIVE   Ketones, ur 20 (A) NEGATIVE mg/dL   Protein, ur NEGATIVE NEGATIVE mg/dL   Nitrite NEGATIVE NEGATIVE   Leukocytes, UA NEGATIVE NEGATIVE   RBC / HPF 0-5 0 - 5 RBC/hpf   WBC, UA 0-5 0 - 5 WBC/hpf   Bacteria, UA MANY (A) NONE SEEN   Squamous Epithelial / LPF 0-5 (A) NONE SEEN   Mucus PRESENT   I-Stat beta hCG blood, ED     Status: None   Collection Time: 06/24/17  8:08 AM  Result Value Ref Range   I-stat hCG, quantitative <5.0 <5 mIU/mL   Comment 3             Comment:   GEST. AGE      CONC.  (mIU/mL)   <=1 WEEK        5 - 50     2 WEEKS       50 - 500  3 WEEKS       100 - 10,000     4 WEEKS     1,000 - 30,000        FEMALE AND NON-PREGNANT FEMALE:     LESS THAN 5 mIU/mL     Ct Abdomen Pelvis W Contrast  Result Date: 06/23/2017 CLINICAL DATA:  Epigastric pain for several days. Vomiting yesterday. EXAM: CT ABDOMEN AND PELVIS WITH CONTRAST TECHNIQUE: Multidetector CT imaging of the abdomen and pelvis was performed using the standard protocol following bolus administration of intravenous contrast. CONTRAST:  142m ISOVUE-300 IOPAMIDOL (ISOVUE-300) INJECTION 61% COMPARISON:  None. FINDINGS: Lower chest: Lung bases are clear. Hepatobiliary: Mild periportal edema. This can represent normal variation but can also be associated with hepatitis. No focal liver abnormality is seen. Status post cholecystectomy. No biliary dilatation. Pancreas: Unremarkable. No pancreatic ductal dilatation or surrounding inflammatory changes. Spleen: Normal in size without focal abnormality. Adrenals/Urinary Tract: Adrenal glands are unremarkable. Kidneys are normal, without renal calculi, focal lesion, or hydronephrosis. Bladder is unremarkable. Stomach/Bowel: Stomach is within normal limits. Appendix appears normal. No evidence of bowel wall thickening, distention, or inflammatory changes. Vascular/Lymphatic: No significant vascular findings are present. No enlarged abdominal or pelvic lymph nodes. Reproductive: Uterus and bilateral adnexa are unremarkable. Small amount of free fluid in the pelvis is probably physiologic. Other: No abdominal wall hernia or abnormality. No abdominopelvic ascites. Musculoskeletal: No acute or significant osseous findings. IMPRESSION: 1. Mild periportal hepatic edema. This may represent normal variation but can be associated with hepatitis. No hepatic abscess or focal lesion. 2. No evidence of bowel obstruction or inflammation. 3. Small amount of free  fluid in the pelvis is likely physiologic. Electronically Signed   By: WLucienne CapersM.D.   On: 06/23/2017 03:37   Mr 3d Recon At Scanner  Result Date: 06/24/2017 CLINICAL DATA:  Epigastric abdominal pain and vomiting. History of cholecystectomy. Nonspecific periportal edema on CT and ultrasound. EXAM: MRI ABDOMEN WITHOUT AND WITH CONTRAST (INCLUDING MRCP) TECHNIQUE: Multiplanar multisequence MR imaging of the abdomen was performed both before and after the administration of intravenous contrast. Heavily T2-weighted images of the biliary and pancreatic ducts were obtained, and three-dimensional MRCP images were rendered by post processing. CONTRAST:  26mMULTIHANCE GADOBENATE DIMEGLUMINE 529 MG/ML IV SOLN COMPARISON:  06/24/2017 abdominal sonogram. 06/23/2017 CT abdomen/ pelvis. FINDINGS: Lower chest: No acute abnormality at the lung bases. Hepatobiliary: Normal liver size and configuration. No hepatic steatosis. No liver mass. Cholecystectomy. There is mild nonspecific periportal edema throughout the liver, most prominent in the anterior right lower lobe. Bile ducts are within normal post cholecystectomy limits. Common bile duct diameter 7 mm. No evidence of choledocholithiasis or biliary mass. Pancreas: No pancreatic mass. No pancreatic duct dilation. Pancreatic duct is diminutive and not well visualized, precluding assessment for pancreas divisum. No significant peripancreatic edema or fluid collections. Spleen: Normal size. No mass. Adrenals/Urinary Tract: Normal adrenals. No hydronephrosis. Normal kidneys with no renal mass. Stomach/Bowel: Grossly normal stomach. Normal caliber visualized small and large bowel. There is wall thickening and mucosal hyperenhancement in the terminal ileum (series 12/image 79), not appreciably changed from the CT study from 1 day prior. Vascular/Lymphatic: Normal caliber abdominal aorta. Patent portal, splenic, hepatic and renal veins. No pathologically enlarged lymph nodes  in the abdomen. Other: No abdominal ascites or focal fluid collection. Musculoskeletal: No aggressive appearing focal osseous lesions. IMPRESSION: 1. Wall thickening and mucosal hyperenhancement in the terminal ileum, compatible with a nonspecific infectious or inflammatory terminal ileitis. Differential includes Crohn disease.  Consider GI consultation, as clinically warranted. 2. Bile ducts are within normal post cholecystectomy limits. CBD diameter 7 mm. No choledocholithiasis. 3. Nonspecific mild periportal edema in the liver. This is commonly due to IV fluid resuscitation in the ER setting. Electronically Signed   By: Ilona Sorrel M.D.   On: 06/24/2017 12:44   Dg Abdomen Acute W/chest  Result Date: 06/24/2017 CLINICAL DATA:  Epigastric pain with nausea and vomiting for the past 3 days. EXAM: DG ABDOMEN ACUTE W/ 1V CHEST COMPARISON:  CT abdomen pelvis dated June 23 2017. Chest x-ray dated March 01, 2014. FINDINGS: There is no evidence of dilated bowel loops or free intraperitoneal air. Fall contrast within the colon. No radiopaque calculi or other significant radiographic abnormality is seen. Prior cholecystectomy. Heart size and mediastinal contours are within normal limits. Both lungs are clear. IMPRESSION: 1. Negative abdominal radiographs. 2. No acute cardiopulmonary disease. Electronically Signed   By: Titus Dubin M.D.   On: 06/24/2017 10:16   Mr Abdomen Mrcp W Wo Contast  Result Date: 06/24/2017 CLINICAL DATA:  Epigastric abdominal pain and vomiting. History of cholecystectomy. Nonspecific periportal edema on CT and ultrasound. EXAM: MRI ABDOMEN WITHOUT AND WITH CONTRAST (INCLUDING MRCP) TECHNIQUE: Multiplanar multisequence MR imaging of the abdomen was performed both before and after the administration of intravenous contrast. Heavily T2-weighted images of the biliary and pancreatic ducts were obtained, and three-dimensional MRCP images were rendered by post processing. CONTRAST:  67m  MULTIHANCE GADOBENATE DIMEGLUMINE 529 MG/ML IV SOLN COMPARISON:  06/24/2017 abdominal sonogram. 06/23/2017 CT abdomen/ pelvis. FINDINGS: Lower chest: No acute abnormality at the lung bases. Hepatobiliary: Normal liver size and configuration. No hepatic steatosis. No liver mass. Cholecystectomy. There is mild nonspecific periportal edema throughout the liver, most prominent in the anterior right lower lobe. Bile ducts are within normal post cholecystectomy limits. Common bile duct diameter 7 mm. No evidence of choledocholithiasis or biliary mass. Pancreas: No pancreatic mass. No pancreatic duct dilation. Pancreatic duct is diminutive and not well visualized, precluding assessment for pancreas divisum. No significant peripancreatic edema or fluid collections. Spleen: Normal size. No mass. Adrenals/Urinary Tract: Normal adrenals. No hydronephrosis. Normal kidneys with no renal mass. Stomach/Bowel: Grossly normal stomach. Normal caliber visualized small and large bowel. There is wall thickening and mucosal hyperenhancement in the terminal ileum (series 12/image 79), not appreciably changed from the CT study from 1 day prior. Vascular/Lymphatic: Normal caliber abdominal aorta. Patent portal, splenic, hepatic and renal veins. No pathologically enlarged lymph nodes in the abdomen. Other: No abdominal ascites or focal fluid collection. Musculoskeletal: No aggressive appearing focal osseous lesions. IMPRESSION: 1. Wall thickening and mucosal hyperenhancement in the terminal ileum, compatible with a nonspecific infectious or inflammatory terminal ileitis. Differential includes Crohn disease. Consider GI consultation, as clinically warranted. 2. Bile ducts are within normal post cholecystectomy limits. CBD diameter 7 mm. No choledocholithiasis. 3. Nonspecific mild periportal edema in the liver. This is commonly due to IV fluid resuscitation in the ER setting. Electronically Signed   By: JIlona SorrelM.D.   On: 06/24/2017 12:44    UKoreaAbdomen Limited Ruq  Result Date: 06/24/2017 CLINICAL DATA:  37year old female with right upper quadrant pain and abnormal liver function tests. Post cholecystectomy. Subsequent encounter. EXAM: ULTRASOUND ABDOMEN LIMITED RIGHT UPPER QUADRANT COMPARISON:  06/23/2017 CT. FINDINGS: Gallbladder: Surgically removed. Common bile duct: Diameter: 3 mm proximally and 6.9 mm distally. Liver: Mild prominence central hepatic ducts. Mild periportal edema. Prominence of the hepatic vein confluence and inferior vena cava. Portal vein is  patent on color Doppler imaging with normal direction of blood flow towards the liver. IMPRESSION: Nonspecific periportal edema. The additional finding of prominence of the hepatic veins and inferior vena cava raises possibility of increased right heart pressure (atypical for patient's age). Alternatively, periportal edema could be related to hepatitis in the proper clinical setting. Post cholecystectomy. The slight prominence of the central intrahepatic biliary ducts and distal common bile duct most likely related to post cholecystectomy state. No obstructing common bile duct stone detected. If patient had persistent elevated liver function studies not explained by hepatitis or other abnormality then follow-up MRCP/liver MR could be obtained for further delineation if clinically desired. Electronically Signed   By: Genia Del M.D.   On: 06/24/2017 10:37   Review of Systems  Constitutional: Negative.   HENT: Negative.   Eyes: Negative.   Respiratory: Negative.   Cardiovascular: Negative.   Gastrointestinal: Positive for abdominal pain, nausea and vomiting. Negative for blood in stool, constipation, diarrhea and melena.  Genitourinary: Negative.   Musculoskeletal: Positive for back pain. Negative for falls, joint pain, myalgias and neck pain.  Skin: Negative.   Neurological: Negative.   Endo/Heme/Allergies: Negative.   Psychiatric/Behavioral: Negative.    Blood pressure  137/71, pulse (!) 53, temperature (!) 97.5 F (36.4 C), temperature source Oral, resp. rate 16, height _0  (1.753 m), weight 99.8 kg (220 lb), SpO2 100 %. Physical Exam  Constitutional: She is oriented to person, place, and time. She appears well-developed and well-nourished.  HENT:  Head: Normocephalic and atraumatic.  Eyes: Conjunctivae and EOM are normal. Pupils are equal, round, and reactive to light.  Neck: Normal range of motion. Neck supple.  Cardiovascular: Normal rate.  Respiratory: Effort normal and breath sounds normal.  GI: Soft. Bowel sounds are normal. She exhibits no mass. There is tenderness. There is guarding. There is no rebound.  Musculoskeletal: Normal range of motion.  Neurological: She is alert and oriented to person, place, and time.  Skin: Skin is warm and dry.  Psychiatric: She has a normal mood and affect. Her behavior is normal. Judgment and thought content normal.   Assessment/Plan: 1) Severe epigastric pain with nausea and vomiting-with normal LFTs and CBC. Reason for her symptoms are not clear to me EGD is planned for tomorrow to rule out peptic ulcer disease will treat symptomatically for now with Zofran and PPIs. Her lipase is normal and there is no evidence of choledocholithiasis on the MRI. She has been advised to avoid all nonsteroidals for now.  2)  Abnormal MRI with mucosal hyperenhancement of the terminal ileum. Patient does not have a fever or elevated white count therefore infection seems less likely she might need a colonoscopy to evaluate this abnormality but as she is having severe nausea and vomiting at this time a colonoscopy will be postponed to her acute symptoms improve.  Traveion Ruddock 06/24/2017, 3:28 PM

## 2017-06-24 NOTE — ED Notes (Signed)
Pt taken by wheelchair to 6n room 3 by ED NT

## 2017-06-25 ENCOUNTER — Observation Stay (HOSPITAL_COMMUNITY): Payer: 59 | Admitting: Certified Registered"

## 2017-06-25 ENCOUNTER — Encounter (HOSPITAL_COMMUNITY)
Admission: EM | Disposition: A | Discharge status: Home / Self Care | Payer: Self-pay | Source: Home / Self Care | Attending: Internal Medicine

## 2017-06-25 ENCOUNTER — Encounter (HOSPITAL_COMMUNITY): Payer: Self-pay | Admitting: Certified Registered"

## 2017-06-25 DIAGNOSIS — R1011 Right upper quadrant pain: Secondary | ICD-10-CM | POA: Diagnosis not present

## 2017-06-25 DIAGNOSIS — M62830 Muscle spasm of back: Secondary | ICD-10-CM | POA: Diagnosis not present

## 2017-06-25 DIAGNOSIS — E669 Obesity, unspecified: Secondary | ICD-10-CM | POA: Diagnosis present

## 2017-06-25 DIAGNOSIS — Z6832 Body mass index (BMI) 32.0-32.9, adult: Secondary | ICD-10-CM | POA: Diagnosis not present

## 2017-06-25 DIAGNOSIS — M545 Low back pain: Secondary | ICD-10-CM | POA: Diagnosis not present

## 2017-06-25 DIAGNOSIS — E860 Dehydration: Secondary | ICD-10-CM | POA: Diagnosis not present

## 2017-06-25 DIAGNOSIS — Z8042 Family history of malignant neoplasm of prostate: Secondary | ICD-10-CM | POA: Diagnosis not present

## 2017-06-25 DIAGNOSIS — R109 Unspecified abdominal pain: Secondary | ICD-10-CM | POA: Diagnosis not present

## 2017-06-25 DIAGNOSIS — K319 Disease of stomach and duodenum, unspecified: Secondary | ICD-10-CM | POA: Diagnosis not present

## 2017-06-25 DIAGNOSIS — K5000 Crohn's disease of small intestine without complications: Secondary | ICD-10-CM | POA: Diagnosis not present

## 2017-06-25 DIAGNOSIS — Z9049 Acquired absence of other specified parts of digestive tract: Secondary | ICD-10-CM | POA: Diagnosis not present

## 2017-06-25 DIAGNOSIS — Z8349 Family history of other endocrine, nutritional and metabolic diseases: Secondary | ICD-10-CM | POA: Diagnosis not present

## 2017-06-25 DIAGNOSIS — I739 Peripheral vascular disease, unspecified: Secondary | ICD-10-CM | POA: Diagnosis not present

## 2017-06-25 DIAGNOSIS — Z9089 Acquired absence of other organs: Secondary | ICD-10-CM | POA: Diagnosis not present

## 2017-06-25 DIAGNOSIS — Z885 Allergy status to narcotic agent status: Secondary | ICD-10-CM | POA: Diagnosis not present

## 2017-06-25 DIAGNOSIS — R933 Abnormal findings on diagnostic imaging of other parts of digestive tract: Secondary | ICD-10-CM | POA: Diagnosis not present

## 2017-06-25 DIAGNOSIS — M549 Dorsalgia, unspecified: Secondary | ICD-10-CM | POA: Diagnosis not present

## 2017-06-25 DIAGNOSIS — R932 Abnormal findings on diagnostic imaging of liver and biliary tract: Secondary | ICD-10-CM | POA: Diagnosis not present

## 2017-06-25 DIAGNOSIS — R112 Nausea with vomiting, unspecified: Secondary | ICD-10-CM | POA: Diagnosis present

## 2017-06-25 DIAGNOSIS — R945 Abnormal results of liver function studies: Secondary | ICD-10-CM | POA: Diagnosis not present

## 2017-06-25 DIAGNOSIS — R1013 Epigastric pain: Secondary | ICD-10-CM | POA: Diagnosis not present

## 2017-06-25 DIAGNOSIS — R001 Bradycardia, unspecified: Secondary | ICD-10-CM | POA: Diagnosis not present

## 2017-06-25 HISTORY — PX: ESOPHAGOGASTRODUODENOSCOPY: SHX5428

## 2017-06-25 LAB — CBC
HEMATOCRIT: 37.4 % (ref 36.0–46.0)
HEMOGLOBIN: 12.1 g/dL (ref 12.0–15.0)
MCH: 29.9 pg (ref 26.0–34.0)
MCHC: 32.4 g/dL (ref 30.0–36.0)
MCV: 92.3 fL (ref 78.0–100.0)
Platelets: 273 10*3/uL (ref 150–400)
RBC: 4.05 MIL/uL (ref 3.87–5.11)
RDW: 12.1 % (ref 11.5–15.5)
WBC: 7.1 10*3/uL (ref 4.0–10.5)

## 2017-06-25 LAB — BASIC METABOLIC PANEL
Anion gap: 9 (ref 5–15)
BUN: 7 mg/dL (ref 6–20)
CHLORIDE: 100 mmol/L — AB (ref 101–111)
CO2: 25 mmol/L (ref 22–32)
Calcium: 8.7 mg/dL — ABNORMAL LOW (ref 8.9–10.3)
Creatinine, Ser: 0.69 mg/dL (ref 0.44–1.00)
GFR calc Af Amer: >60 mL/min (ref >60)
GFR calc non Af Amer: >60 mL/min (ref >60)
Glucose, Bld: 83 mg/dL (ref 65–99)
POTASSIUM: 3.4 mmol/L — AB (ref 3.5–5.1)
SODIUM: 134 mmol/L — AB (ref 135–145)

## 2017-06-25 LAB — HIV ANTIBODY (ROUTINE TESTING W REFLEX): HIV Screen 4th Generation wRfx: NONREACTIVE

## 2017-06-25 SURGERY — EGD (ESOPHAGOGASTRODUODENOSCOPY)
Anesthesia: General | Laterality: Left

## 2017-06-25 MED ORDER — CIPROFLOXACIN IN D5W 400 MG/200ML IV SOLN
400.0000 mg | Freq: Two times a day (BID) | INTRAVENOUS | Status: DC
Start: 2017-06-25 — End: 2017-06-28
  Administered 2017-06-25 – 2017-06-28 (×6): 400 mg via INTRAVENOUS
  Filled 2017-06-25 (×7): qty 200

## 2017-06-25 MED ORDER — PROPOFOL 10 MG/ML IV BOLUS
INTRAVENOUS | Status: DC | PRN
  Administered 2017-06-25: 150 mg via INTRAVENOUS
  Administered 2017-06-25: 50 mg via INTRAVENOUS

## 2017-06-25 MED ORDER — MORPHINE SULFATE (PF) 4 MG/ML IV SOLN
4.0000 mg | INTRAVENOUS | Status: DC | PRN
  Administered 2017-06-25 – 2017-06-27 (×9): 4 mg via INTRAVENOUS
  Filled 2017-06-25 (×11): qty 1

## 2017-06-25 MED ORDER — LIDOCAINE 2% (20 MG/ML) 5 ML SYRINGE
INTRAMUSCULAR | Status: DC | PRN
  Administered 2017-06-25: 40 mg via INTRAVENOUS

## 2017-06-25 MED ORDER — SODIUM CHLORIDE 0.9 % IV SOLN: INTRAVENOUS | Status: DC

## 2017-06-25 MED ORDER — SUCCINYLCHOLINE CHLORIDE 20 MG/ML IJ SOLN
INTRAMUSCULAR | Status: DC | PRN
  Administered 2017-06-25: 60 mg via INTRAVENOUS
  Administered 2017-06-25: 140 mg via INTRAVENOUS

## 2017-06-25 MED ORDER — ONDANSETRON HCL 4 MG/2ML IJ SOLN
INTRAMUSCULAR | Status: DC | PRN
  Administered 2017-06-25: 4 mg via INTRAVENOUS

## 2017-06-25 MED ORDER — MORPHINE SULFATE (PF) 4 MG/ML IV SOLN
2.0000 mg | INTRAVENOUS | Status: DC
  Administered 2017-06-25 – 2017-06-27 (×14): 2 mg via INTRAVENOUS
  Filled 2017-06-25 (×14): qty 1

## 2017-06-25 NOTE — Op Note (Signed)
[facility] Patient Name: Maureen Ballard Procedure Date : 06/25/2017 MRN: [id number] Attending MD: Carol Ada , MD Date of Birth: Aug 26, 1980 CSN: [id number] Age: 37 Admit Type: Inpatient Procedure:                Upper GI endoscopy Indications:              Epigastric abdominal pain, Nausea with vomiting Providers:                Carol Ada, MD, Cleda Daub, RN, Elspeth Cho                            Tech., Technician, Sampson Si, CRNA Referring MD:              Medicines:                General Anesthesia Complications:            No immediate complications. Estimated Blood Loss:     Estimated blood loss was minimal. Procedure:                Pre-Anesthesia Assessment:                           - Prior to the procedure, a History and Physical                            was performed, and patient medications and                            allergies were reviewed. The patient's tolerance of                            previous anesthesia was also reviewed. The risks                            and benefits of the procedure and the sedation                            options and risks were discussed with the patient.                            All questions were answered, and informed consent                            was obtained. Prior Anticoagulants: The patient has                            taken no previous anticoagulant or antiplatelet                            agents. ASA Grade Assessment: II - A patient with                            mild systemic disease. After reviewing the risks  and benefits, the patient was deemed in                            satisfactory condition to undergo the procedure.                           - Sedation was administered by an anesthesia                            professional. General anesthesia was attained.                           After obtaining informed consent, the endoscope was                   passed under direct vision. Throughout the                            procedure, the patient's blood pressure, pulse, and                            oxygen saturations were monitored continuously. The                            [id number] ([id number]) scope was introduced through the                            mouth, and advanced to the second part of duodenum.                            The upper GI endoscopy was accomplished without                            difficulty. The patient tolerated the procedure                            well. Scope In: Scope Out: Findings:      The esophagus was normal.      The entire examined stomach was normal. Biopsies were taken with a cold       forceps for histology.      The examined duodenum was normal. Impression:               - Normal esophagus.                           - Normal stomach. Biopsied.                           - Normal examined duodenum. Moderate Sedation:      None Recommendation:           - Return patient to hospital ward for ongoing care.                           - NPO.                           -  Continue present medications.                           - Await pathology results.                           - Cipro (ciprofloxacin) 400 mg IV q 12 hr. There is                            a terminal ileitis noted on the MRI. This may be                            infectious versus inflammatory, however, both                            etiologies will respond to Cipro. Procedure Code(s):        --- Professional ---                           [phone], Esophagogastroduodenoscopy, flexible,                            transoral; with biopsy, single or multiple Diagnosis Code(s):        --- Professional ---                           R10.13, Epigastric pain                           R11.2, Nausea with vomiting, unspecified CPT copyright 2016 American Medical Association. All rights reserved. The codes documented in this  report are preliminary and upon coder review may  be revised to meet current compliance requirements. Carol Ada, MD Carol Ada, MD 06/25/2017 1:35:10 PM This report has been signed electronically. Number of Addenda: 0

## 2017-06-25 NOTE — Progress Notes (Signed)
1241 Pt taken down for EGD.

## 2017-06-25 NOTE — Anesthesia Preprocedure Evaluation (Addendum)
Anesthesia Evaluation  Patient identified by MRN, date of birth, ID band Patient awake    Reviewed: Allergy & Precautions, NPO status , Patient's Chart, lab work & pertinent test results  Airway Mallampati: I  TM Distance: >3 FB Neck ROM: Full    Dental  (+) Teeth Intact, Dental Advisory Given   Pulmonary neg pulmonary ROS,    breath sounds clear to auscultation       Cardiovascular + Peripheral Vascular Disease  negative cardio ROS   Rhythm:Regular Rate:Normal     Neuro/Psych negative neurological ROS  negative psych ROS   GI/Hepatic Neg liver ROS, GERD  Medicated,  Endo/Other  negative endocrine ROS  Renal/GU negative Renal ROS     Musculoskeletal  (+) Arthritis ,   Abdominal   Peds  Hematology negative hematology ROS (+)   Anesthesia Other Findings   Reproductive/Obstetrics                            Anesthesia Physical Anesthesia Plan  ASA: II  Anesthesia Plan: General   Post-op Pain Management:    Induction: Intravenous, Rapid sequence and Cricoid pressure planned  PONV Risk Score and Plan: 3 and Ondansetron, Midazolam and Treatment may vary due to age or medical condition  Airway Management Planned: Oral ETT  Additional Equipment: None  Intra-op Plan:   Post-operative Plan: Extubation in OR  Informed Consent: I have reviewed the patients History and Physical, chart, labs and discussed the procedure including the risks, benefits and alternatives for the proposed anesthesia with the patient or authorized representative who has indicated his/her understanding and acceptance.   Dental advisory given  Plan Discussed with: CRNA  Anesthesia Plan Comments:         Anesthesia Quick Evaluation

## 2017-06-25 NOTE — Interval H&P Note (Signed)
History and Physical Interval Note:  06/25/2017 1:05 PM  Maureen Ballard  has presented today for surgery, with the diagnosis of Epigastric pain, nausea and vomiting  The various methods of treatment have been discussed with the patient and family. After consideration of risks, benefits and other options for treatment, the patient has consented to  Procedure(s): ESOPHAGOGASTRODUODENOSCOPY (EGD) (Left) as a surgical intervention .  The patient's history has been reviewed, patient examined, no change in status, stable for surgery.  I have reviewed the patient's chart and labs.  Questions were answered to the patient's satisfaction.     [name] D

## 2017-06-25 NOTE — Transfer of Care (Signed)
Immediate Anesthesia Transfer of Care Note  Patient: Maureen Ballard  Procedure(s) Performed: ESOPHAGOGASTRODUODENOSCOPY (EGD) (Left )  Patient Location: Endoscopy Unit  Anesthesia Type:General  Level of Consciousness: drowsy and patient cooperative  Airway & Oxygen Therapy: Patient Spontanous Breathing and Patient connected to nasal cannula oxygen  Post-op Assessment: Report given to RN  Post vital signs: Reviewed and stable  Last Vitals:  Vitals:   06/25/17 1255 06/25/17 1340  BP: (!) 160/83 134/71  Pulse: 68 84  Resp: 16 16  Temp: 36.8 C   SpO2: 98% 100%    Last Pain:  Vitals:   06/25/17 1255  TempSrc: Oral  PainSc: 5          Complications: No apparent anesthesia complications

## 2017-06-25 NOTE — Anesthesia Procedure Notes (Signed)
Procedure Name: Intubation Date/Time: 06/25/2017 1:14 PM Performed by: Barrington Ellison, CRNA Pre-anesthesia Checklist: Patient identified, Emergency Drugs available, Suction available and Patient being monitored Patient Re-evaluated:Patient Re-evaluated prior to induction Oxygen Delivery Method: Circle System Utilized Preoxygenation: Pre-oxygenation with 100% oxygen Induction Type: IV induction, Rapid sequence and Cricoid Pressure applied Ventilation: Mask ventilation without difficulty Laryngoscope Size: Mac and 3 Grade View: Grade I Tube type: Oral Tube size: 7.0 mm Number of attempts: 1 Airway Equipment and Method: Stylet and Oral airway Placement Confirmation: ETT inserted through vocal cords under direct vision,  positive ETCO2 and breath sounds checked- equal and bilateral Secured at: 21 cm Tube secured with: Tape Dental Injury: Teeth and Oropharynx as per pre-operative assessment

## 2017-06-25 NOTE — Progress Notes (Signed)
PROGRESS NOTE  Maureen Ballard  DHR:[id number] DOB: 20-Sep-1980 DOA: 06/24/2017 PCP: Jani Gravel, MD   Brief Narrative: Maureen Ballard is a 37 y.o. female with no chronic medical conditions who presented to the ED with epigastric, radiating to RUQ abdominal pain that is severe, progressive over 5 days, constant and becoming associated with nausea, emesis, and inability to tolerate per oral intake. Previous evaluations included CT showing periportal hepatic edema and little pelvic free fluid. She was discharged from the ED with normal labs, prescribed PPI. When symptoms worsened she returned to the ED where an abdominal series was normal and abd U/S again showed periportal edema, prominent hepatic veins and IVC w/slightly dilated intrhepatic ducts and distal CBD consistent with h/o cholecystectomy. A follow-up MRI was done as her symptoms could not be explained with the above tests and on the MRI she was noted to have wall thickening and mucosal hyperenhancement in the terminal ileum question infectious versus inflammatory slightly but dilated bile duct at 7 mm consistent with post cholecystomy cystectomy status with no evidence of choledocholithiasis. Again nonspecific mild periportal edema was noted.   Labs have been essentially normal, though urine showed ketones at admission. EGD 1/8 showed normal esophagus, stomach, and examined duodenum.   Assessment & Plan: Principal Problem:   Abdominal pain  Intractable N/V and abdominal pain: Normal LFTs reassuring. EGD negative for ulcer.  - She remains unable to tolerate anything by mouth, therefore requires IV antiemetics and pain medications in addition to IVF's.  - Follow up on gastric biopsy, for now on empiric PPI - IV cipro started per GI for terminal ileitis.  - Ok to trial clear liquids as ordered by GI.  DVT prophylaxis: SCDs Code Status: Full Family Communication: Husband at bedside Disposition Plan: Home once tolerating  po  Consultants:   GI  Procedures:   EGD 06/25/2017 by Dr. Benson Norway: Findings:      The esophagus was normal.      The entire examined stomach was normal. Biopsies were taken with a cold       forceps for histology.      The examined duodenum was normal. Recommendation:            - Return patient to hospital ward for ongoing care. - Continue present medications. - Await pathology results. - Cipro (ciprofloxacin) 400 mg IV q 12 hr. There is a terminal ileitis noted on the MRI. This may be infectious versus inflammatory, however, both etiologies will respond to Cipro.  Antimicrobials:  Ciprofloxacin 1/8 >>   Subjective: Morphine 66m not controlling pain. Not able to take anything by mouth so far.   Objective: Vitals:   06/25/17 1400 06/25/17 1405 06/25/17 1410 06/25/17 1558  BP: (!) 152/81  (!) 162/108 (!) 154/94  Pulse: 76  82 71  Resp: 20  (!) 21 18  Temp:    99.1 F (37.3 C)  TempSrc:    Oral  SpO2: 97% 95% 98% 96%  Weight:      Height:        Intake/Output Summary (Last 24 hours) at 06/25/2017 1643 Last data filed at 06/25/2017 1335 Gross per 24 hour  Intake 1300 ml  Output 0 ml  Net 1300 ml   Filed Weights   06/24/17 0748 06/24/17 0802  Weight: 99.8 kg (220 lb) 99.8 kg (220 lb)    Gen: Uncomfortable-appearing 37y.o. female in no distress Pulm: Non-labored breathing room air. Clear to auscultation bilaterally.  CV: Regular rate and rhythm.  No murmur, rub, or gallop. No JVD, no pedal edema. GI: Abdomen soft, tender in epigastrium and RUQ without rebound, +voluntary guarding, nondistended with normoactive bowel sounds. No organomegaly or masses felt. Ext: Warm, no deformities Skin: No rashes, lesions no ulcers Neuro: Alert and oriented. No focal neurological deficits. Psych: Judgement and insight appear normal. Mood & affect appropriate.   Data Reviewed: I have personally reviewed following labs and imaging studies  CBC: Recent Labs  Lab 06/23/17 0100  06/24/17 0800 06/25/17 0817  WBC 5.7 6.0 7.1  HGB 13.5 12.6 12.1  HCT 38.2 37.9 37.4  MCV 90.5 90.9 92.3  PLT 344 300 035   Basic Metabolic Panel: Recent Labs  Lab 06/23/17 0100 06/24/17 0800 06/25/17 0817  NA 136 135 134*  K 3.7 3.8 3.4*  CL 102 101 100*  CO2 25 24 25   GLUCOSE 105* 112* 83  BUN 10 6 7   CREATININE 0.82 0.70 0.69  CALCIUM 9.0 8.9 8.7*   GFR: Estimated Creatinine Clearance: 121 mL/min (by C-G formula based on SCr of 0.69 mg/dL). Liver Function Tests: Recent Labs  Lab 06/23/17 0100 06/24/17 0800  AST 37 35  ALT 39 38  ALKPHOS 50 44  BILITOT 0.6 1.1  PROT 6.9 6.6  ALBUMIN 4.1 3.9   Recent Labs  Lab 06/23/17 0100 06/24/17 0800  LIPASE 26 28   No results for input(s): AMMONIA in the last 168 hours. Coagulation Profile: No results for input(s): INR, PROTIME in the last 168 hours. Cardiac Enzymes: No results for input(s): CKTOTAL, CKMB, CKMBINDEX, TROPONINI in the last 168 hours. BNP (last 3 results) No results for input(s): PROBNP in the last 8760 hours. HbA1C: No results for input(s): HGBA1C in the last 72 hours. CBG: No results for input(s): GLUCAP in the last 168 hours. Lipid Profile: No results for input(s): CHOL, HDL, LDLCALC, TRIG, CHOLHDL, LDLDIRECT in the last 72 hours. Thyroid Function Tests: No results for input(s): TSH, T4TOTAL, FREET4, T3FREE, THYROIDAB in the last 72 hours. Anemia Panel: No results for input(s): VITAMINB12, FOLATE, FERRITIN, TIBC, IRON, RETICCTPCT in the last 72 hours. Urine analysis:    Component Value Date/Time   COLORURINE YELLOW 06/24/2017 0804   APPEARANCEUR CLEAR 06/24/2017 0804   LABSPEC 1.009 06/24/2017 0804   PHURINE 6.0 06/24/2017 0804   GLUCOSEU NEGATIVE 06/24/2017 0804   HGBUR SMALL (A) 06/24/2017 0804   BILIRUBINUR NEGATIVE 06/24/2017 0804   KETONESUR 20 (A) 06/24/2017 0804   PROTEINUR NEGATIVE 06/24/2017 0804   NITRITE NEGATIVE 06/24/2017 0804   LEUKOCYTESUR NEGATIVE 06/24/2017 0804   No  results found for this or any previous visit (from the past 240 hour(s)).    Radiology Studies: Mr 3d Recon At Scanner  Result Date: 06/24/2017 CLINICAL DATA:  Epigastric abdominal pain and vomiting. History of cholecystectomy. Nonspecific periportal edema on CT and ultrasound. EXAM: MRI ABDOMEN WITHOUT AND WITH CONTRAST (INCLUDING MRCP) TECHNIQUE: Multiplanar multisequence MR imaging of the abdomen was performed both before and after the administration of intravenous contrast. Heavily T2-weighted images of the biliary and pancreatic ducts were obtained, and three-dimensional MRCP images were rendered by post processing. CONTRAST:  54m MULTIHANCE GADOBENATE DIMEGLUMINE 529 MG/ML IV SOLN COMPARISON:  06/24/2017 abdominal sonogram. 06/23/2017 CT abdomen/ pelvis. FINDINGS: Lower chest: No acute abnormality at the lung bases. Hepatobiliary: Normal liver size and configuration. No hepatic steatosis. No liver mass. Cholecystectomy. There is mild nonspecific periportal edema throughout the liver, most prominent in the anterior right lower lobe. Bile ducts are within normal post cholecystectomy limits. Common  bile duct diameter 7 mm. No evidence of choledocholithiasis or biliary mass. Pancreas: No pancreatic mass. No pancreatic duct dilation. Pancreatic duct is diminutive and not well visualized, precluding assessment for pancreas divisum. No significant peripancreatic edema or fluid collections. Spleen: Normal size. No mass. Adrenals/Urinary Tract: Normal adrenals. No hydronephrosis. Normal kidneys with no renal mass. Stomach/Bowel: Grossly normal stomach. Normal caliber visualized small and large bowel. There is wall thickening and mucosal hyperenhancement in the terminal ileum (series 12/image 79), not appreciably changed from the CT study from 1 day prior. Vascular/Lymphatic: Normal caliber abdominal aorta. Patent portal, splenic, hepatic and renal veins. No pathologically enlarged lymph nodes in the abdomen.  Other: No abdominal ascites or focal fluid collection. Musculoskeletal: No aggressive appearing focal osseous lesions. IMPRESSION: 1. Wall thickening and mucosal hyperenhancement in the terminal ileum, compatible with a nonspecific infectious or inflammatory terminal ileitis. Differential includes Crohn disease. Consider GI consultation, as clinically warranted. 2. Bile ducts are within normal post cholecystectomy limits. CBD diameter 7 mm. No choledocholithiasis. 3. Nonspecific mild periportal edema in the liver. This is commonly due to IV fluid resuscitation in the ER setting. Electronically Signed   By: Ilona Sorrel M.D.   On: 06/24/2017 12:44   Dg Abdomen Acute W/chest  Result Date: 06/24/2017 CLINICAL DATA:  Epigastric pain with nausea and vomiting for the past 3 days. EXAM: DG ABDOMEN ACUTE W/ 1V CHEST COMPARISON:  CT abdomen pelvis dated June 23 2017. Chest x-ray dated March 01, 2014. FINDINGS: There is no evidence of dilated bowel loops or free intraperitoneal air. Fall contrast within the colon. No radiopaque calculi or other significant radiographic abnormality is seen. Prior cholecystectomy. Heart size and mediastinal contours are within normal limits. Both lungs are clear. IMPRESSION: 1. Negative abdominal radiographs. 2. No acute cardiopulmonary disease. Electronically Signed   By: Titus Dubin M.D.   On: 06/24/2017 10:16   Mr Abdomen Mrcp W Wo Contast  Result Date: 06/24/2017 CLINICAL DATA:  Epigastric abdominal pain and vomiting. History of cholecystectomy. Nonspecific periportal edema on CT and ultrasound. EXAM: MRI ABDOMEN WITHOUT AND WITH CONTRAST (INCLUDING MRCP) TECHNIQUE: Multiplanar multisequence MR imaging of the abdomen was performed both before and after the administration of intravenous contrast. Heavily T2-weighted images of the biliary and pancreatic ducts were obtained, and three-dimensional MRCP images were rendered by post processing. CONTRAST:  68m MULTIHANCE  GADOBENATE DIMEGLUMINE 529 MG/ML IV SOLN COMPARISON:  06/24/2017 abdominal sonogram. 06/23/2017 CT abdomen/ pelvis. FINDINGS: Lower chest: No acute abnormality at the lung bases. Hepatobiliary: Normal liver size and configuration. No hepatic steatosis. No liver mass. Cholecystectomy. There is mild nonspecific periportal edema throughout the liver, most prominent in the anterior right lower lobe. Bile ducts are within normal post cholecystectomy limits. Common bile duct diameter 7 mm. No evidence of choledocholithiasis or biliary mass. Pancreas: No pancreatic mass. No pancreatic duct dilation. Pancreatic duct is diminutive and not well visualized, precluding assessment for pancreas divisum. No significant peripancreatic edema or fluid collections. Spleen: Normal size. No mass. Adrenals/Urinary Tract: Normal adrenals. No hydronephrosis. Normal kidneys with no renal mass. Stomach/Bowel: Grossly normal stomach. Normal caliber visualized small and large bowel. There is wall thickening and mucosal hyperenhancement in the terminal ileum (series 12/image 79), not appreciably changed from the CT study from 1 day prior. Vascular/Lymphatic: Normal caliber abdominal aorta. Patent portal, splenic, hepatic and renal veins. No pathologically enlarged lymph nodes in the abdomen. Other: No abdominal ascites or focal fluid collection. Musculoskeletal: No aggressive appearing focal osseous lesions. IMPRESSION: 1. Wall thickening  and mucosal hyperenhancement in the terminal ileum, compatible with a nonspecific infectious or inflammatory terminal ileitis. Differential includes Crohn disease. Consider GI consultation, as clinically warranted. 2. Bile ducts are within normal post cholecystectomy limits. CBD diameter 7 mm. No choledocholithiasis. 3. Nonspecific mild periportal edema in the liver. This is commonly due to IV fluid resuscitation in the ER setting. Electronically Signed   By: Ilona Sorrel M.D.   On: 06/24/2017 12:44   US  Abdomen Limited Ruq  Result Date: 06/24/2017 CLINICAL DATA:  37 year old female with right upper quadrant pain and abnormal liver function tests. Post cholecystectomy. Subsequent encounter. EXAM: ULTRASOUND ABDOMEN LIMITED RIGHT UPPER QUADRANT COMPARISON:  06/23/2017 CT. FINDINGS: Gallbladder: Surgically removed. Common bile duct: Diameter: 3 mm proximally and 6.9 mm distally. Liver: Mild prominence central hepatic ducts. Mild periportal edema. Prominence of the hepatic vein confluence and inferior vena cava. Portal vein is patent on color Doppler imaging with normal direction of blood flow towards the liver. IMPRESSION: Nonspecific periportal edema. The additional finding of prominence of the hepatic veins and inferior vena cava raises possibility of increased right heart pressure (atypical for patient's age). Alternatively, periportal edema could be related to hepatitis in the proper clinical setting. Post cholecystectomy. The slight prominence of the central intrahepatic biliary ducts and distal common bile duct most likely related to post cholecystectomy state. No obstructing common bile duct stone detected. If patient had persistent elevated liver function studies not explained by hepatitis or other abnormality then follow-up MRCP/liver MR could be obtained for further delineation if clinically desired. Electronically Signed   By: Genia Del M.D.   On: 06/24/2017 10:37    Scheduled Meds: . docusate sodium  100 mg Oral BID  .  morphine injection  2 mg Intravenous Q4H  . pantoprazole (PROTONIX) IV  40 mg Intravenous Q12H   Continuous Infusions: . ciprofloxacin    . lactated ringers 100 mL/hr at 06/25/17 1615  . ondansetron (ZOFRAN) IV Stopped (06/25/17 0430)     LOS: 0 days   Time spent: 25 minutes.  Vance Gather, MD [facility] Pager [phone]  If 7PM-7AM, please contact night-coverage [email or web address] Password [facility] 06/25/2017, 4:43 PM

## 2017-06-25 NOTE — Anesthesia Postprocedure Evaluation (Signed)
Anesthesia Post Note  Patient: Maureen Ballard  Procedure(s) Performed: ESOPHAGOGASTRODUODENOSCOPY (EGD) (Left )     Patient location during evaluation: PACU Anesthesia Type: General Level of consciousness: awake and alert Pain management: pain level controlled Vital Signs Assessment: post-procedure vital signs reviewed and stable Respiratory status: spontaneous breathing, nonlabored ventilation, respiratory function stable and patient connected to nasal cannula oxygen Cardiovascular status: blood pressure returned to baseline and stable Postop Assessment: no apparent nausea or vomiting Anesthetic complications: no    Last Vitals:  Vitals:   06/25/17 1350 06/25/17 1400  BP:  (!) 152/81  Pulse: 78 76  Resp: 16 20  Temp:    SpO2: 100% 97%                  Effie Berkshire

## 2017-06-26 DIAGNOSIS — R1013 Epigastric pain: Secondary | ICD-10-CM

## 2017-06-26 DIAGNOSIS — R112 Nausea with vomiting, unspecified: Secondary | ICD-10-CM

## 2017-06-26 DIAGNOSIS — R1011 Right upper quadrant pain: Secondary | ICD-10-CM

## 2017-06-26 DIAGNOSIS — R945 Abnormal results of liver function studies: Secondary | ICD-10-CM

## 2017-06-26 LAB — COMPREHENSIVE METABOLIC PANEL
ALK PHOS: 46 U/L (ref 38–126)
ALT: 39 U/L (ref 14–54)
AST: 34 U/L (ref 15–41)
Albumin: 3.6 g/dL (ref 3.5–5.0)
Anion gap: 7 (ref 5–15)
CALCIUM: 8.8 mg/dL — AB (ref 8.9–10.3)
CO2: 29 mmol/L (ref 22–32)
CREATININE: 0.86 mg/dL (ref 0.44–1.00)
Chloride: 100 mmol/L — ABNORMAL LOW (ref 101–111)
Glucose, Bld: 98 mg/dL (ref 65–99)
Potassium: 3.8 mmol/L (ref 3.5–5.1)
Sodium: 136 mmol/L (ref 135–145)
Total Bilirubin: 1.2 mg/dL (ref 0.3–1.2)
Total Protein: 6.5 g/dL (ref 6.5–8.1)

## 2017-06-26 LAB — BASIC METABOLIC PANEL
ANION GAP: 9 (ref 5–15)
BUN: 5 mg/dL — AB (ref 6–20)
CHLORIDE: 98 mmol/L — AB (ref 101–111)
CO2: 28 mmol/L (ref 22–32)
Calcium: 9 mg/dL (ref 8.9–10.3)
Creatinine, Ser: 0.74 mg/dL (ref 0.44–1.00)
GFR calc Af Amer: >60 mL/min (ref >60)
GFR calc non Af Amer: >60 mL/min (ref >60)
GLUCOSE: 109 mg/dL — AB (ref 65–99)
Potassium: 3.9 mmol/L (ref 3.5–5.1)
Sodium: 135 mmol/L (ref 135–145)

## 2017-06-26 MED ORDER — GI COCKTAIL ~~LOC~~
30.0000 mL | Freq: Once | ORAL | Status: AC
  Administered 2017-06-26: 30 mL via ORAL
  Filled 2017-06-26 (×2): qty 30

## 2017-06-26 NOTE — Progress Notes (Signed)
PROGRESS NOTE  Maureen Ballard  MBE:[id number] DOB: 03-14-1981 DOA: 06/24/2017 PCP: Jani Gravel, MD   Brief Narrative: Maureen Ballard is a 37 y.o. female with no chronic medical conditions who presented to the ED with severe epigastric pain x 5 days associated with nausea, emesis, and inability to tolerate oral intake. CT abd pelvis w contrast 06/23/17 revealed periportal hepatic edema and little pelvic free fluid. Discharged from the ED with normal labs, prescribed PPI. Symptoms worsened and returned to the ED where abdominal series normal and abd U/S again showed periportal edema, prominent hepatic veins and IVC w/slightly dilated intrahepatic ducts and distal CBD consistent with h/o cholecystectomy. MRI 06/24/17 revealed wall thickening and mucosal hyperenhancement in the terminal ileum question infectious versus inflammatory slightly but dilated bile duct at 7 mm consistent with post cholecystectomy status with no evidence of choledocholithiasis. Again nonspecific mild periportal edema was noted. GI consulted and following. EGD 06/25/17 showed normal esophagus, stomach, and examined duodenum.   Patient seen and examined with her husband at her bedside. Patient is somnolent post IV opiates but arousable to voices. Unable to obtain a review of systems due to somnolence. Does not appear in acute distress.  Assessment & Plan: Principal Problem:   Abdominal pain Active Problems:   Intractable nausea and vomiting  Intractable N/V and abdominal pain with suspected ileitis  - Normal LFTs. EGD negative for ulcer.  - She remains unable to tolerate anything by mouth, therefore requires IV antiemetics and pain medications in addition to IVF's.  - IV morphine 2 mg q4H and q4h prn for breakthrough pain - IV zofran and IV phenergan as needed - Follow up on gastric biopsy, for now on empiric PPI - IV protonix 40 mg BID - IV cipro started per GI for terminal ileitis.  - clear liquids as ordered by GI - LR at  100 cc/hr  DVT prophylaxis: SCDs Code Status: Full Family Communication: Husband at bedside Disposition Plan: Home once tolerating po  Consultants:   GI  Procedures:   EGD 06/25/2017 by Dr. Benson Norway: Findings:      The esophagus was normal.      The entire examined stomach was normal. Biopsies were taken with a cold       forceps for histology.      The examined duodenum was normal. Recommendation:            - Return patient to hospital ward for ongoing care. - Continue present medications. - Await pathology results. - Cipro (ciprofloxacin) 400 mg IV q 12 hr. There is a terminal ileitis noted on the MRI. This may be infectious versus inflammatory, however, both etiologies will respond to Cipro.  Antimicrobials:  Ciprofloxacin 1/8 >>    Objective: Vitals:   06/25/17 1558 06/25/17 2015 06/26/17 0142 06/26/17 0603  BP: (!) 154/94 (!) 159/94 (!) 158/93 (!) 161/97  Pulse: 71 64 64 73  Resp: 18 18 18 18   Temp: 99.1 F (37.3 C) 98.3 F (36.8 C) 98.7 F (37.1 C) 97.6 F (36.4 C)  TempSrc: Oral Oral Oral Oral  SpO2: 96% 100% 100% 100%  Weight:      Height:        Intake/Output Summary (Last 24 hours) at 06/26/2017 0936 Last data filed at 06/26/2017 0847 Gross per 24 hour  Intake 2231.33 ml  Output 0 ml  Net 2231.33 ml   Filed Weights   06/24/17 0748 06/24/17 0802  Weight: 99.8 kg (220 lb) 99.8 kg (220 lb)  Gen:  37 y.o. female in no distress, somnolent due to IV opiates Pulm: Non-labored breathing room air. Clear to auscultation bilaterally.  CV: Regular rate and rhythm. No murmur, rub, or gallop. No JVD, no pedal edema. GI: Abdomen soft, tender in epigastrium and RUQ without rebound, +voluntary guarding, nondistended with normoactive bowel sounds. No organomegaly or masses felt. Ext: Warm, no deformities Skin: No rashes, lesions no ulcers Neuro: Alert and oriented. No focal neurological deficits. Psych: Judgement and insight appear normal. Mood & affect appropriate.    Data Reviewed: I have personally reviewed following labs and imaging studies  CBC: Recent Labs  Lab 06/23/17 0100 06/24/17 0800 06/25/17 0817  WBC 5.7 6.0 7.1  HGB 13.5 12.6 12.1  HCT 38.2 37.9 37.4  MCV 90.5 90.9 92.3  PLT 344 300 470   Basic Metabolic Panel: Recent Labs  Lab 06/23/17 0100 06/24/17 0800 06/25/17 0817 06/26/17 0432  NA 136 135 134* 135  K 3.7 3.8 3.4* 3.9  CL 102 101 100* 98*  CO2 25 24 25 28   GLUCOSE 105* 112* 83 109*  BUN 10 6 7  5*  CREATININE 0.82 0.70 0.69 0.74  CALCIUM 9.0 8.9 8.7* 9.0   GFR: Estimated Creatinine Clearance: 121 mL/min (by C-G formula based on SCr of 0.74 mg/dL). Liver Function Tests: Recent Labs  Lab 06/23/17 0100 06/24/17 0800  AST 37 35  ALT 39 38  ALKPHOS 50 44  BILITOT 0.6 1.1  PROT 6.9 6.6  ALBUMIN 4.1 3.9   Recent Labs  Lab 06/23/17 0100 06/24/17 0800  LIPASE 26 28   No results for input(s): AMMONIA in the last 168 hours. Coagulation Profile: No results for input(s): INR, PROTIME in the last 168 hours. Cardiac Enzymes: No results for input(s): CKTOTAL, CKMB, CKMBINDEX, TROPONINI in the last 168 hours. BNP (last 3 results) No results for input(s): PROBNP in the last 8760 hours. HbA1C: No results for input(s): HGBA1C in the last 72 hours. CBG: No results for input(s): GLUCAP in the last 168 hours. Lipid Profile: No results for input(s): CHOL, HDL, LDLCALC, TRIG, CHOLHDL, LDLDIRECT in the last 72 hours. Thyroid Function Tests: No results for input(s): TSH, T4TOTAL, FREET4, T3FREE, THYROIDAB in the last 72 hours. Anemia Panel: No results for input(s): VITAMINB12, FOLATE, FERRITIN, TIBC, IRON, RETICCTPCT in the last 72 hours. Urine analysis:    Component Value Date/Time   COLORURINE YELLOW 06/24/2017 0804   APPEARANCEUR CLEAR 06/24/2017 0804   LABSPEC 1.009 06/24/2017 0804   PHURINE 6.0 06/24/2017 0804   GLUCOSEU NEGATIVE 06/24/2017 0804   HGBUR SMALL (A) 06/24/2017 0804   BILIRUBINUR NEGATIVE  06/24/2017 0804   KETONESUR 20 (A) 06/24/2017 0804   PROTEINUR NEGATIVE 06/24/2017 0804   NITRITE NEGATIVE 06/24/2017 0804   LEUKOCYTESUR NEGATIVE 06/24/2017 0804   No results found for this or any previous visit (from the past 240 hour(s)).    Radiology Studies: Mr 3d Recon At Scanner  Result Date: 06/24/2017 CLINICAL DATA:  Epigastric abdominal pain and vomiting. History of cholecystectomy. Nonspecific periportal edema on CT and ultrasound. EXAM: MRI ABDOMEN WITHOUT AND WITH CONTRAST (INCLUDING MRCP) TECHNIQUE: Multiplanar multisequence MR imaging of the abdomen was performed both before and after the administration of intravenous contrast. Heavily T2-weighted images of the biliary and pancreatic ducts were obtained, and three-dimensional MRCP images were rendered by post processing. CONTRAST:  27m MULTIHANCE GADOBENATE DIMEGLUMINE 529 MG/ML IV SOLN COMPARISON:  06/24/2017 abdominal sonogram. 06/23/2017 CT abdomen/ pelvis. FINDINGS: Lower chest: No acute abnormality at the lung bases. Hepatobiliary:  Normal liver size and configuration. No hepatic steatosis. No liver mass. Cholecystectomy. There is mild nonspecific periportal edema throughout the liver, most prominent in the anterior right lower lobe. Bile ducts are within normal post cholecystectomy limits. Common bile duct diameter 7 mm. No evidence of choledocholithiasis or biliary mass. Pancreas: No pancreatic mass. No pancreatic duct dilation. Pancreatic duct is diminutive and not well visualized, precluding assessment for pancreas divisum. No significant peripancreatic edema or fluid collections. Spleen: Normal size. No mass. Adrenals/Urinary Tract: Normal adrenals. No hydronephrosis. Normal kidneys with no renal mass. Stomach/Bowel: Grossly normal stomach. Normal caliber visualized small and large bowel. There is wall thickening and mucosal hyperenhancement in the terminal ileum (series 12/image 79), not appreciably changed from the CT study from  1 day prior. Vascular/Lymphatic: Normal caliber abdominal aorta. Patent portal, splenic, hepatic and renal veins. No pathologically enlarged lymph nodes in the abdomen. Other: No abdominal ascites or focal fluid collection. Musculoskeletal: No aggressive appearing focal osseous lesions. IMPRESSION: 1. Wall thickening and mucosal hyperenhancement in the terminal ileum, compatible with a nonspecific infectious or inflammatory terminal ileitis. Differential includes Crohn disease. Consider GI consultation, as clinically warranted. 2. Bile ducts are within normal post cholecystectomy limits. CBD diameter 7 mm. No choledocholithiasis. 3. Nonspecific mild periportal edema in the liver. This is commonly due to IV fluid resuscitation in the ER setting. Electronically Signed   By: Ilona Sorrel M.D.   On: 06/24/2017 12:44   Dg Abdomen Acute W/chest  Result Date: 06/24/2017 CLINICAL DATA:  Epigastric pain with nausea and vomiting for the past 3 days. EXAM: DG ABDOMEN ACUTE W/ 1V CHEST COMPARISON:  CT abdomen pelvis dated June 23 2017. Chest x-ray dated March 01, 2014. FINDINGS: There is no evidence of dilated bowel loops or free intraperitoneal air. Fall contrast within the colon. No radiopaque calculi or other significant radiographic abnormality is seen. Prior cholecystectomy. Heart size and mediastinal contours are within normal limits. Both lungs are clear. IMPRESSION: 1. Negative abdominal radiographs. 2. No acute cardiopulmonary disease. Electronically Signed   By: Titus Dubin M.D.   On: 06/24/2017 10:16   Mr Abdomen Mrcp W Wo Contast  Result Date: 06/24/2017 CLINICAL DATA:  Epigastric abdominal pain and vomiting. History of cholecystectomy. Nonspecific periportal edema on CT and ultrasound. EXAM: MRI ABDOMEN WITHOUT AND WITH CONTRAST (INCLUDING MRCP) TECHNIQUE: Multiplanar multisequence MR imaging of the abdomen was performed both before and after the administration of intravenous contrast. Heavily  T2-weighted images of the biliary and pancreatic ducts were obtained, and three-dimensional MRCP images were rendered by post processing. CONTRAST:  88m MULTIHANCE GADOBENATE DIMEGLUMINE 529 MG/ML IV SOLN COMPARISON:  06/24/2017 abdominal sonogram. 06/23/2017 CT abdomen/ pelvis. FINDINGS: Lower chest: No acute abnormality at the lung bases. Hepatobiliary: Normal liver size and configuration. No hepatic steatosis. No liver mass. Cholecystectomy. There is mild nonspecific periportal edema throughout the liver, most prominent in the anterior right lower lobe. Bile ducts are within normal post cholecystectomy limits. Common bile duct diameter 7 mm. No evidence of choledocholithiasis or biliary mass. Pancreas: No pancreatic mass. No pancreatic duct dilation. Pancreatic duct is diminutive and not well visualized, precluding assessment for pancreas divisum. No significant peripancreatic edema or fluid collections. Spleen: Normal size. No mass. Adrenals/Urinary Tract: Normal adrenals. No hydronephrosis. Normal kidneys with no renal mass. Stomach/Bowel: Grossly normal stomach. Normal caliber visualized small and large bowel. There is wall thickening and mucosal hyperenhancement in the terminal ileum (series 12/image 79), not appreciably changed from the CT study from 1 day prior. Vascular/Lymphatic:  Normal caliber abdominal aorta. Patent portal, splenic, hepatic and renal veins. No pathologically enlarged lymph nodes in the abdomen. Other: No abdominal ascites or focal fluid collection. Musculoskeletal: No aggressive appearing focal osseous lesions. IMPRESSION: 1. Wall thickening and mucosal hyperenhancement in the terminal ileum, compatible with a nonspecific infectious or inflammatory terminal ileitis. Differential includes Crohn disease. Consider GI consultation, as clinically warranted. 2. Bile ducts are within normal post cholecystectomy limits. CBD diameter 7 mm. No choledocholithiasis. 3. Nonspecific mild periportal  edema in the liver. This is commonly due to IV fluid resuscitation in the ER setting. Electronically Signed   By: Ilona Sorrel M.D.   On: 06/24/2017 12:44   US Abdomen Limited Ruq  Result Date: 06/24/2017 CLINICAL DATA:  37 year old female with right upper quadrant pain and abnormal liver function tests. Post cholecystectomy. Subsequent encounter. EXAM: ULTRASOUND ABDOMEN LIMITED RIGHT UPPER QUADRANT COMPARISON:  06/23/2017 CT. FINDINGS: Gallbladder: Surgically removed. Common bile duct: Diameter: 3 mm proximally and 6.9 mm distally. Liver: Mild prominence central hepatic ducts. Mild periportal edema. Prominence of the hepatic vein confluence and inferior vena cava. Portal vein is patent on color Doppler imaging with normal direction of blood flow towards the liver. IMPRESSION: Nonspecific periportal edema. The additional finding of prominence of the hepatic veins and inferior vena cava raises possibility of increased right heart pressure (atypical for patient's age). Alternatively, periportal edema could be related to hepatitis in the proper clinical setting. Post cholecystectomy. The slight prominence of the central intrahepatic biliary ducts and distal common bile duct most likely related to post cholecystectomy state. No obstructing common bile duct stone detected. If patient had persistent elevated liver function studies not explained by hepatitis or other abnormality then follow-up MRCP/liver MR could be obtained for further delineation if clinically desired. Electronically Signed   By: Genia Del M.D.   On: 06/24/2017 10:37    Scheduled Meds: . docusate sodium  100 mg Oral BID  .  morphine injection  2 mg Intravenous Q4H  . pantoprazole (PROTONIX) IV  40 mg Intravenous Q12H   Continuous Infusions: . ciprofloxacin Stopped (06/26/17 0324)  . lactated ringers 100 mL/hr at 06/26/17 0603  . ondansetron (ZOFRAN) IV Stopped (06/26/17 0222)     LOS: 1 day   Time spent: 25 minutes.  Kayleen Memos, MD [facility] Pager [phone]  If 7PM-7AM, please contact night-coverage [email or web address] Password [facility] 06/26/2017, 9:36 AM

## 2017-06-26 NOTE — Progress Notes (Signed)
Unassigned patient Subjective: Since I last evaluated the patient, there has not been much change in her overall status causing the patient and her husband significant concern adequate by my discussion with Dr. Irene Pap, [facility] taking care of the patient today. Patient had a CT scan of the abdomen and pelvis that revealed mild periportal and hepatic edema with no evidence of bowel obstruction or inflammation and a small amount of free fluid in the pelvis. An MRI and follow-up revealed wall thickening and mucosal hyperenhancement in the terminal ileum for which she's been started on Ciprofloxacin. Patient states is not much difference in the pain she's been having and the pain medication she is receiving are not helping with her symptoms much. She's had some nausea but has not vomited today. Her EGD done yesterday was normal gastric biopsies were done and results are pending. Her LFTs are to be repeated again tomorrow.  Objective: Vital signs in last 24 hours: Temp:  [97.6 F (36.4 C)-99.1 F (37.3 C)] 97.6 F (36.4 C) (01/09 0603) Pulse Rate:  [64-84] 73 (01/09 0603) Resp:  [16-21] 18 (01/09 0603) BP: (134-162)/(71-108) 161/97 (01/09 0603) SpO2:  [95 %-100 %] 100 % (01/09 0603) Last BM Date: 06/23/17  Intake/Output from previous day: 01/08 0701 - 01/09 0700 In: 2111.3 [P.O.:240; I.V.:1763.3; IV Piggyback:108] Out: 0  Intake/Output this shift: Total I/O In: 120 [P.O.:120] Out: -   General appearance: cooperative, appears stated age, fatigued and moderately obese Resp: clear to auscultation bilaterally Cardio: regular rate and rhythm, S1, S2 normal, no murmur, click, rub or gallop GI: soft, with epigastric tenderness on palpation with guarding but without rebound or rigidity bowel sounds normal; no masses,  no organomegaly Extremities: extremities normal, atraumatic, no cyanosis or edema  Lab Results: Recent Labs    06/24/17 0800 06/25/17 0817  WBC 6.0 7.1  HGB 12.6  12.1  HCT 37.9 37.4  PLT 300 273   BMET Recent Labs    06/24/17 0800 06/25/17 0817 06/26/17 0432  NA 135 134* 135  K 3.8 3.4* 3.9  CL 101 100* 98*  CO2 24 25 28   GLUCOSE 112* 83 109*  BUN 6 7 5*  CREATININE 0.70 0.69 0.74  CALCIUM 8.9 8.7* 9.0   LFT Recent Labs    06/24/17 0800  PROT 6.6  ALBUMIN 3.9  AST 35  ALT 38  ALKPHOS 44  BILITOT 1.1   Studies/Results: Mr 3d Recon At Scanner  Result Date: 06/24/2017 CLINICAL DATA:  Epigastric abdominal pain and vomiting. History of cholecystectomy. Nonspecific periportal edema on CT and ultrasound. EXAM: MRI ABDOMEN WITHOUT AND WITH CONTRAST (INCLUDING MRCP) TECHNIQUE: Multiplanar multisequence MR imaging of the abdomen was performed both before and after the administration of intravenous contrast. Heavily T2-weighted images of the biliary and pancreatic ducts were obtained, and three-dimensional MRCP images were rendered by post processing. CONTRAST:  29m MULTIHANCE GADOBENATE DIMEGLUMINE 529 MG/ML IV SOLN COMPARISON:  06/24/2017 abdominal sonogram. 06/23/2017 CT abdomen/ pelvis. FINDINGS: Lower chest: No acute abnormality at the lung bases. Hepatobiliary: Normal liver size and configuration. No hepatic steatosis. No liver mass. Cholecystectomy. There is mild nonspecific periportal edema throughout the liver, most prominent in the anterior right lower lobe. Bile ducts are within normal post cholecystectomy limits. Common bile duct diameter 7 mm. No evidence of choledocholithiasis or biliary mass. Pancreas: No pancreatic mass. No pancreatic duct dilation. Pancreatic duct is diminutive and not well visualized, precluding assessment for pancreas divisum. No significant peripancreatic edema or fluid collections. Spleen: Normal size.  No mass. Adrenals/Urinary Tract: Normal adrenals. No hydronephrosis. Normal kidneys with no renal mass. Stomach/Bowel: Grossly normal stomach. Normal caliber visualized small and large bowel. There is wall thickening  and mucosal hyperenhancement in the terminal ileum (series 12/image 79), not appreciably changed from the CT study from 1 day prior. Vascular/Lymphatic: Normal caliber abdominal aorta. Patent portal, splenic, hepatic and renal veins. No pathologically enlarged lymph nodes in the abdomen. Other: No abdominal ascites or focal fluid collection. Musculoskeletal: No aggressive appearing focal osseous lesions. IMPRESSION: 1. Wall thickening and mucosal hyperenhancement in the terminal ileum, compatible with a nonspecific infectious or inflammatory terminal ileitis. Differential includes Crohn disease. Consider GI consultation, as clinically warranted. 2. Bile ducts are within normal post cholecystectomy limits. CBD diameter 7 mm. No choledocholithiasis. 3. Nonspecific mild periportal edema in the liver. This is commonly due to IV fluid resuscitation in the ER setting. Electronically Signed   By: Ilona Sorrel M.D.   On: 06/24/2017 12:44   Dg Abdomen Acute W/chest  Result Date: 06/24/2017 CLINICAL DATA:  Epigastric pain with nausea and vomiting for the past 3 days. EXAM: DG ABDOMEN ACUTE W/ 1V CHEST COMPARISON:  CT abdomen pelvis dated June 23 2017. Chest x-ray dated March 01, 2014. FINDINGS: There is no evidence of dilated bowel loops or free intraperitoneal air. Fall contrast within the colon. No radiopaque calculi or other significant radiographic abnormality is seen. Prior cholecystectomy. Heart size and mediastinal contours are within normal limits. Both lungs are clear. IMPRESSION: 1. Negative abdominal radiographs. 2. No acute cardiopulmonary disease. Electronically Signed   By: Titus Dubin M.D.   On: 06/24/2017 10:16   Mr Abdomen Mrcp W Wo Contast  Result Date: 06/24/2017 CLINICAL DATA:  Epigastric abdominal pain and vomiting. History of cholecystectomy. Nonspecific periportal edema on CT and ultrasound. EXAM: MRI ABDOMEN WITHOUT AND WITH CONTRAST (INCLUDING MRCP) TECHNIQUE: Multiplanar multisequence  MR imaging of the abdomen was performed both before and after the administration of intravenous contrast. Heavily T2-weighted images of the biliary and pancreatic ducts were obtained, and three-dimensional MRCP images were rendered by post processing. CONTRAST:  28m MULTIHANCE GADOBENATE DIMEGLUMINE 529 MG/ML IV SOLN COMPARISON:  06/24/2017 abdominal sonogram. 06/23/2017 CT abdomen/ pelvis. FINDINGS: Lower chest: No acute abnormality at the lung bases. Hepatobiliary: Normal liver size and configuration. No hepatic steatosis. No liver mass. Cholecystectomy. There is mild nonspecific periportal edema throughout the liver, most prominent in the anterior right lower lobe. Bile ducts are within normal post cholecystectomy limits. Common bile duct diameter 7 mm. No evidence of choledocholithiasis or biliary mass. Pancreas: No pancreatic mass. No pancreatic duct dilation. Pancreatic duct is diminutive and not well visualized, precluding assessment for pancreas divisum. No significant peripancreatic edema or fluid collections. Spleen: Normal size. No mass. Adrenals/Urinary Tract: Normal adrenals. No hydronephrosis. Normal kidneys with no renal mass. Stomach/Bowel: Grossly normal stomach. Normal caliber visualized small and large bowel. There is wall thickening and mucosal hyperenhancement in the terminal ileum (series 12/image 79), not appreciably changed from the CT study from 1 day prior. Vascular/Lymphatic: Normal caliber abdominal aorta. Patent portal, splenic, hepatic and renal veins. No pathologically enlarged lymph nodes in the abdomen. Other: No abdominal ascites or focal fluid collection. Musculoskeletal: No aggressive appearing focal osseous lesions. IMPRESSION: 1. Wall thickening and mucosal hyperenhancement in the terminal ileum, compatible with a nonspecific infectious or inflammatory terminal ileitis. Differential includes Crohn disease. Consider GI consultation, as clinically warranted. 2. Bile ducts are  within normal post cholecystectomy limits. CBD diameter 7 mm. No choledocholithiasis. 3.  Nonspecific mild periportal edema in the liver. This is commonly due to IV fluid resuscitation in the ER setting. Electronically Signed   By: Ilona Sorrel M.D.   On: 06/24/2017 12:44   US Abdomen Limited Ruq  Result Date: 06/24/2017 CLINICAL DATA:  37 year old female with right upper quadrant pain and abnormal liver function tests. Post cholecystectomy. Subsequent encounter. EXAM: ULTRASOUND ABDOMEN LIMITED RIGHT UPPER QUADRANT COMPARISON:  06/23/2017 CT. FINDINGS: Gallbladder: Surgically removed. Common bile duct: Diameter: 3 mm proximally and 6.9 mm distally. Liver: Mild prominence central hepatic ducts. Mild periportal edema. Prominence of the hepatic vein confluence and inferior vena cava. Portal vein is patent on color Doppler imaging with normal direction of blood flow towards the liver. IMPRESSION: Nonspecific periportal edema. The additional finding of prominence of the hepatic veins and inferior vena cava raises possibility of increased right heart pressure (atypical for patient's age). Alternatively, periportal edema could be related to hepatitis in the proper clinical setting. Post cholecystectomy. The slight prominence of the central intrahepatic biliary ducts and distal common bile duct most likely related to post cholecystectomy state. No obstructing common bile duct stone detected. If patient had persistent elevated liver function studies not explained by hepatitis or other abnormality then follow-up MRCP/liver MR could be obtained for further delineation if clinically desired. Electronically Signed   By: Genia Del M.D.   On: 06/24/2017 10:37   Medications: I have reviewed the patient's current medications.  Assessment/Plan: Severe epigastric pain with nausea and vomiting. Patient's EGD and has been unrevealing an MRI showed some terminal ileitis for which the patient receiving Ciprofloxacin. One dose  of GI cocktail was advised today. I have had an extensive discussion with her husband Corene Cornea about maybe doing a CT scan of the abdomen and pelvis without contrast tomorrow to rule out nephrolithiasis if her symptoms do not improve by tomorrow. Will continue PPIs along with antiemetics and IV morphine for now.  LOS: 1 day   Huberta Tompkins 06/26/2017, 9:30 AM

## 2017-06-27 ENCOUNTER — Encounter (HOSPITAL_COMMUNITY): Payer: Self-pay | Admitting: Gastroenterology

## 2017-06-27 ENCOUNTER — Inpatient Hospital Stay (HOSPITAL_COMMUNITY): Payer: 59

## 2017-06-27 LAB — CBC
HCT: 41.9 % (ref 36.0–46.0)
Hemoglobin: 14.5 g/dL (ref 12.0–15.0)
MCH: 31.6 pg (ref 26.0–34.0)
MCHC: 34.6 g/dL (ref 30.0–36.0)
MCV: 91.3 fL (ref 78.0–100.0)
Platelets: 340 10*3/uL (ref 150–400)
RBC: 4.59 MIL/uL (ref 3.87–5.11)
RDW: 12.5 % (ref 11.5–15.5)
WBC: 7.5 10*3/uL (ref 4.0–10.5)

## 2017-06-27 MED ORDER — BACLOFEN 10 MG PO TABS
5.0000 mg | ORAL_TABLET | Freq: Three times a day (TID) | ORAL | Status: DC
  Administered 2017-06-27 – 2017-06-28 (×4): 5 mg via ORAL
  Filled 2017-06-27 (×4): qty 1

## 2017-06-27 MED ORDER — METHYLPREDNISOLONE SODIUM SUCC 40 MG IJ SOLR
40.0000 mg | Freq: Every day | INTRAMUSCULAR | Status: DC
  Administered 2017-06-27 – 2017-06-28 (×2): 40 mg via INTRAVENOUS
  Filled 2017-06-27 (×2): qty 1

## 2017-06-27 NOTE — Progress Notes (Signed)
PROGRESS NOTE  REIZEL CALZADA  YYT:[id number] DOB: 27-Jun-1980 DOA: 06/24/2017 PCP: Jani Gravel, MD   Brief Narrative: Maureen Ballard is a 37 y.o. female with no chronic medical conditions who presented to the ED with severe epigastric pain x 5 days associated with nausea, emesis, and inability to tolerate oral intake. CT abd pelvis w contrast 06/23/17 revealed periportal hepatic edema and little pelvic free fluid. Discharged from the ED with normal labs, prescribed PPI. Symptoms worsened and returned to the ED where abdominal series normal and abd U/S again showed periportal edema, prominent hepatic veins and IVC w/slightly dilated intrahepatic ducts and distal CBD consistent with h/o cholecystectomy. MRI 06/24/17 revealed wall thickening and mucosal hyperenhancement in the terminal ileum question infectious versus inflammatory slightly but dilated bile duct at 7 mm consistent with post cholecystectomy status with no evidence of choledocholithiasis. Again nonspecific mild periportal edema was noted. GI consulted and following. EGD 06/25/17 showed normal esophagus, stomach, and examined duodenum.   Seen and examined at her bedside. Reports mild improvement of epigastric pain after GI cocktail yesterday 06/26/17. Persistent abdominal pain now 7/10 achy and uncomfortable. Nausea is stable when not in excruciating pain. Also reports lower back pain achy and improved when she stands.   Assessment & Plan: Principal Problem:   Abdominal pain Active Problems:   Intractable nausea and vomiting  Intractable N/V and abdominal pain with suspected ileitis  - Normal LFTs. EGD negative for ulcer.  - She remains unable to tolerate anything by mouth, therefore requires IV antiemetics and pain medications in addition to IVF's.  - IV morphine 2 mg q4H and q4h prn for breakthrough pain - IV zofran and IV phenergan as needed - Follow up on gastric biopsy, for now on empiric PPI - IV protonix 40 mg BID - IV cipro  started per GI for terminal ileitis day # 3 - clear liquids as ordered by GI - LR at 100 cc/hr - GI cocktail prn - repeat CT ab/pelv wo contrast revealed improved ileitis/. No nephrolitiasis. No abscess. - continue supportive care  DVT prophylaxis: SCDs Code Status: Full Family Communication: Husband at bedside Disposition Plan: Home once tolerating po  Consultants:   GI  Procedures:   EGD 06/25/2017 by Dr. Benson Norway: Findings:      The esophagus was normal.      The entire examined stomach was normal. Biopsies were taken with a cold       forceps for histology.      The examined duodenum was normal. Recommendation:            - Return patient to hospital ward for ongoing care. - Continue present medications. - Await pathology results. - Cipro (ciprofloxacin) 400 mg IV q 12 hr. There is a terminal ileitis noted on the MRI. This may be infectious versus inflammatory, however, both etiologies will respond to Cipro.  Antimicrobials:  Ciprofloxacin 1/8 >>    Objective: Vitals:   06/26/17 0946 06/26/17 1429 06/26/17 2246 06/27/17 0537  BP: 127/88 (!) 149/98 140/84 (!) 146/95  Pulse: 79 99 (!) 120 86  Resp:  18 18 18   Temp: 98.8 F (37.1 C) 98.9 F (37.2 C) 98.4 F (36.9 C) 98.8 F (37.1 C)  TempSrc: Oral Tympanic Oral Oral  SpO2: 96% 100% 100% 100%  Weight:      Height:        Intake/Output Summary (Last 24 hours) at 06/27/2017 0809 Last data filed at 06/27/2017 0535 Gross per 24 hour  Intake 3627.34  ml  Output -  Net 3627.34 ml   Filed Weights   06/24/17 0748 06/24/17 0802  Weight: 99.8 kg (220 lb) 99.8 kg (220 lb)    Gen:  37 y.o. female in no distress Pulm: Non-labored breathing room air. Clear to auscultation bilaterally.  CV: Regular rate and rhythm. No murmur, rub, or gallop. No JVD, no pedal edema. GI: Abdomen soft, tender in epigastrium and RUQ without rebound, +voluntary guarding, nondistended with normoactive bowel sounds. No organomegaly or masses  felt. Ext: Warm, no deformities Skin: No rashes, lesions no ulcers Neuro: Alert and oriented. No focal neurological deficits. Psych: Judgement and insight appear normal. Mood & affect appropriate.   Data Reviewed: I have personally reviewed following labs and imaging studies  CBC: Recent Labs  Lab 06/23/17 0100 06/24/17 0800 06/25/17 0817  WBC 5.7 6.0 7.1  HGB 13.5 12.6 12.1  HCT 38.2 37.9 37.4  MCV 90.5 90.9 92.3  PLT 344 300 166   Basic Metabolic Panel: Recent Labs  Lab 06/23/17 0100 06/24/17 0800 06/25/17 0817 06/26/17 0432 06/26/17 1408  NA 136 135 134* 135 136  K 3.7 3.8 3.4* 3.9 3.8  CL 102 101 100* 98* 100*  CO2 25 24 25 28 29   GLUCOSE 105* 112* 83 109* 98  BUN 10 6 7  5* <5*  CREATININE 0.82 0.70 0.69 0.74 0.86  CALCIUM 9.0 8.9 8.7* 9.0 8.8*   GFR: Estimated Creatinine Clearance: 112.5 mL/min (by C-G formula based on SCr of 0.86 mg/dL). Liver Function Tests: Recent Labs  Lab 06/23/17 0100 06/24/17 0800 06/26/17 1408  AST 37 35 34  ALT 39 38 39  ALKPHOS 50 44 46  BILITOT 0.6 1.1 1.2  PROT 6.9 6.6 6.5  ALBUMIN 4.1 3.9 3.6   Recent Labs  Lab 06/23/17 0100 06/24/17 0800  LIPASE 26 28   No results for input(s): AMMONIA in the last 168 hours. Coagulation Profile: No results for input(s): INR, PROTIME in the last 168 hours. Cardiac Enzymes: No results for input(s): CKTOTAL, CKMB, CKMBINDEX, TROPONINI in the last 168 hours. BNP (last 3 results) No results for input(s): PROBNP in the last 8760 hours. HbA1C: No results for input(s): HGBA1C in the last 72 hours. CBG: No results for input(s): GLUCAP in the last 168 hours. Lipid Profile: No results for input(s): CHOL, HDL, LDLCALC, TRIG, CHOLHDL, LDLDIRECT in the last 72 hours. Thyroid Function Tests: No results for input(s): TSH, T4TOTAL, FREET4, T3FREE, THYROIDAB in the last 72 hours. Anemia Panel: No results for input(s): VITAMINB12, FOLATE, FERRITIN, TIBC, IRON, RETICCTPCT in the last 72  hours. Urine analysis:    Component Value Date/Time   COLORURINE YELLOW 06/24/2017 0804   APPEARANCEUR CLEAR 06/24/2017 0804   LABSPEC 1.009 06/24/2017 0804   PHURINE 6.0 06/24/2017 0804   GLUCOSEU NEGATIVE 06/24/2017 0804   HGBUR SMALL (A) 06/24/2017 0804   BILIRUBINUR NEGATIVE 06/24/2017 0804   KETONESUR 20 (A) 06/24/2017 0804   PROTEINUR NEGATIVE 06/24/2017 0804   NITRITE NEGATIVE 06/24/2017 0804   LEUKOCYTESUR NEGATIVE 06/24/2017 0804   No results found for this or any previous visit (from the past 240 hour(s)).    Radiology Studies: No results found.  Scheduled Meds: . docusate sodium  100 mg Oral BID  .  morphine injection  2 mg Intravenous Q4H  . pantoprazole (PROTONIX) IV  40 mg Intravenous Q12H   Continuous Infusions: . ciprofloxacin Stopped (06/27/17 0353)  . lactated ringers 100 mL/hr at 06/26/17 2240  . ondansetron (ZOFRAN) IV Stopped (06/26/17 0222)  LOS: 2 days   Time spent: 25 minutes.  Kayleen Memos, MD [facility] Pager [phone]  If 7PM-7AM, please contact night-coverage [email or web address] Password [email or web address] 06/27/2017, 8:09 AM

## 2017-06-27 NOTE — Progress Notes (Signed)
Subjective: No significant improvement.  At best, a minimal improvement.  Objective: Vital signs in last 24 hours: Temp:  [98.4 F (36.9 C)-98.9 F (37.2 C)] 98.8 F (37.1 C) (01/10 0537) Pulse Rate:  [86-120] 86 (01/10 0537) Resp:  [18] 18 (01/10 0537) BP: (140-149)/(84-98) 146/95 (01/10 0537) SpO2:  [100 %] 100 % (01/10 0537) Last BM Date: 06/23/17  Intake/Output from previous day: 01/09 0701 - 01/10 0700 In: 3627.3 [P.O.:820; I.V.:2353.3; IV Piggyback:454] Out: -  Intake/Output this shift: No intake/output data recorded.  General appearance: alert and moderate distress GI: mid paraspinal back pain, epigastric pain  Lab Results: Recent Labs    06/25/17 0817 06/27/17 0614  WBC 7.1 7.5  HGB 12.1 14.5  HCT 37.4 41.9  PLT 273 340   BMET Recent Labs    06/25/17 0817 06/26/17 0432 06/26/17 1408  NA 134* 135 136  K 3.4* 3.9 3.8  CL 100* 98* 100*  CO2 25 28 29   GLUCOSE 83 109* 98  BUN 7 5* <5*  CREATININE 0.69 0.74 0.86  CALCIUM 8.7* 9.0 8.8*   LFT Recent Labs    06/26/17 1408  PROT 6.5  ALBUMIN 3.6  AST 34  ALT 39  ALKPHOS 46  BILITOT 1.2   PT/INR No results for input(s): LABPROT, INR in the last 72 hours. Hepatitis Panel No results for input(s): HEPBSAG, HCVAB, HEPAIGM, HEPBIGM in the last 72 hours. C-Diff No results for input(s): CDIFFTOX in the last 72 hours. Fecal Lactopherrin No results for input(s): FECLLACTOFRN in the last 72 hours.  Studies/Results: Ct Abdomen Pelvis Wo Contrast  Result Date: 06/27/2017 CLINICAL DATA:  Abdominal pain and fever. Abscess suspected. Terminal ileitis. Question abscess. EXAM: CT ABDOMEN AND PELVIS WITHOUT CONTRAST TECHNIQUE: Multidetector CT imaging of the abdomen and pelvis was performed following the standard protocol without IV contrast. COMPARISON:  MRI of the abdomen 06/24/2016. CT of the abdomen and pelvis 06/23/2017. FINDINGS: Lower chest: The lung bases are clear without focal nodule, mass, or airspace  disease. Heart size is normal. No significant pleural or pericardial effusion is present. Hepatobiliary: Mild periportal edema is improved. No mass lesion is present. There is no significant duct dilation. Cholecystectomy is noted. The common bile duct is unremarkable. Pancreas: Unremarkable. No pancreatic ductal dilatation or surrounding inflammatory changes. Spleen: Normal in size without focal abnormality. Adrenals/Urinary Tract: Adrenal glands and kidneys are within normal limits. The ureters and urinary bladder are unremarkable. Stomach/Bowel: Stomach and duodenum are within normal limits. Previously noted inflammatory changes of the terminal ileum have significantly improved. There is no discrete abscess. Oral contrast is noted within the colon. The appendix is normal. The ascending and transverse colon are within normal limits. Descending and sigmoid colon are unremarkable. Vascular/Lymphatic: No significant vascular findings are present. No enlarged abdominal or pelvic lymph nodes. Reproductive: Uterus and bilateral adnexa are unremarkable. Other: No abdominal wall hernia or abnormality. No abdominopelvic ascites. Musculoskeletal: Vertebral body heights and alignment are normal. Facet degenerative changes are noted L4-5 and L5-S1 bilaterally. The bony pelvis is intact. The hips are located and within normal limits bilaterally. IMPRESSION: 1. Previously noted inflammatory changes of the terminal ileum have significantly improved and essentially resolved. There is no abscess. 2. The improved periportal edema of the liver without a discrete lesion or obstruction. 3. No other acute or focal abnormality to explain ongoing symptoms. Electronically Signed   By: San Morelle M.D.   On: 06/27/2017 11:41    Medications:  Scheduled: . baclofen  5 mg Oral TID  .  docusate sodium  100 mg Oral BID  . methylPREDNISolone (SOLU-MEDROL) injection  40 mg Intravenous Daily  .  morphine injection  2 mg Intravenous  Q4H  . pantoprazole (PROTONIX) IV  40 mg Intravenous Q12H   Continuous: . ciprofloxacin Stopped (06/27/17 0353)  . lactated ringers 75 mL/hr at 06/27/17 0819  . ondansetron (ZOFRAN) IV Stopped (06/26/17 0222)    Assessment/Plan: 1) Epigastric pain. 2) Mid paraspinal back pain.   The noncontrast CT scan was negative for any overt abnormalities.  The previously identified periportal edema and the terminal ileitis have improved/almost resolved.  Clinically there is no significant change.  She does complain about back pain and this causes her to walk hunched over.  It is difficult for her to lie flat.  Plan: 1) Trial of Solumedrol and Baclofen. 2) Continue supportive care.  LOS: 2 days   Tawana Pasch D 06/27/2017, 1:56 PM

## 2017-06-28 DIAGNOSIS — M545 Low back pain: Secondary | ICD-10-CM

## 2017-06-28 MED ORDER — METHYLPREDNISOLONE 2 MG PO TABS: ORAL_TABLET | ORAL | 0 refills | Status: AC

## 2017-06-28 MED ORDER — BACLOFEN 5 MG PO TABS: 5.0000 mg | ORAL_TABLET | Freq: Three times a day (TID) | ORAL | 0 refills | Status: AC

## 2017-06-28 NOTE — Evaluation (Signed)
Physical Therapy Evaluation Patient Details Name: Maureen Ballard MRN: [id number] DOB: 07-22-1980 Today's Date: 06/28/2017   History of Present Illness  Maureen Ballard is a 37 y.o. female with no chronic medical conditions who presented to the ED with severe epigastric pain x 5 days associated with nausea, emesis, and inability to tolerate oral intake. CT abd pelvis w contrast 06/23/17 revealed periportal hepatic edema and little pelvic free fluid. Discharged from the ED with normal labs, prescribed PPI. Symptoms worsened and returned to the ED where abdominal series normal and abd U/S again showed periportal edema, prominent hepatic veins and IVC w/slightly dilated intrahepatic ducts and distal CBD consistent with h/o cholecystectomy. MRI 06/24/17 revealed wall thickening and mucosal hyperenhancement in the terminal ileum question infectious versus inflammatory slightly but dilated bile duct at 7 mm consistent with post cholecystectomy status with no evidence of choledocholithiasis. Again nonspecific mild periportal edema was noted. GI consulted and following. EGD 06/25/17 showed normal esophagus, stomach, and examined duodenum.      Clinical Impression  Patient evaluated by Physical Therapy with no further acute PT needs identified. All education has been completed and the patient has no further questions. PTA, patient was independent with all mobility living with husband and 2 children with stairs to enter. Upon eval, patient presents with mild epigastric and back pain that she reports has improved over course of her hospitalization. Patient is mod I with all OOB mobility and able to ambulate without AD long distances. Discussed therex and modality with patient for pain control of paraspinals. Pt reports no concerns at this time. PT is signing off. Thank you for this referral.     Follow Up Recommendations No PT follow up;Supervision for mobility/OOB    Equipment Recommendations  None recommended  by PT    Recommendations for Other Services       Precautions / Restrictions Precautions Precautions: None Restrictions Weight Bearing Restrictions: No      Mobility  Bed Mobility Overal bed mobility: Modified Independent                Transfers Overall transfer level: Modified independent                  Ambulation/Gait Ambulation/Gait assistance: Supervision;Modified independent (Device/Increase time) Ambulation Distance (Feet): 505 Feet Assistive device: None Gait Pattern/deviations: WFL(Within Functional Limits) Gait velocity: normal   General Gait Details: gait WNL, holds onto IV pole sometimes and reports light dizziness but able to ambulate without IV pole without path deviation  Stairs            Wheelchair Mobility    Modified Rankin (Stroke Patients Only)       Balance Overall balance assessment: Modified Independent                                           Pertinent Vitals/Pain Pain Assessment: 0-10 Pain Score: 2  Pain Location: epigastric, paraspinal Pain Intervention(s): Monitored during session    Home Living Family/patient expects to be discharged to:: Private residence Living Arrangements: Spouse/significant other;Children Available Help at Discharge: Family;Available PRN/intermittently(Husband) Type of Home: House Home Access: Stairs to enter Entrance Stairs-Rails: Can reach both Entrance Stairs-Number of Steps: 4  Home Layout: One level Home Equipment: None      Prior Function Level of Independence: Independent  Hand Dominance        Extremity/Trunk Assessment   Upper Extremity Assessment Upper Extremity Assessment: Overall WFL for tasks assessed    Lower Extremity Assessment Lower Extremity Assessment: Overall WFL for tasks assessed    Cervical / Trunk Assessment Cervical / Trunk Assessment: Normal  Communication   Communication: No difficulties  Cognition  Arousal/Alertness: Awake/alert Behavior During Therapy: WFL for tasks assessed/performed Overall Cognitive Status: Within Functional Limits for tasks assessed                                        General Comments General comments (skin integrity, edema, etc.): Discussed with patient pain control for paraspinals with therex and modalties.     Exercises     Assessment/Plan    PT Assessment Patent does not need any further PT services  PT Problem List         PT Treatment Interventions      PT Goals (Current goals can be found in the Care Plan section)  Acute Rehab PT Goals Patient Stated Goal: return home feeling better PT Goal Formulation: With patient Time For Goal Achievement: 07/05/17 Potential to Achieve Goals: Good    Frequency     Barriers to discharge        Co-evaluation               AM-PAC PT "6 Clicks" Daily Activity  Outcome Measure Difficulty turning over in bed (including adjusting bedclothes, sheets and blankets)?: A Little Difficulty moving from lying on back to sitting on the side of the bed? : A Little Difficulty sitting down on and standing up from a chair with arms (e.g., wheelchair, bedside commode, etc,.)?: A Little Help needed moving to and from a bed to chair (including a wheelchair)?: None Help needed walking in hospital room?: None Help needed climbing 3-5 steps with a railing? : None 6 Click Score: 21    End of Session   Activity Tolerance: Patient tolerated treatment well Patient left: in chair;with call bell/phone within reach Nurse Communication: Mobility status PT Visit Diagnosis: Other abnormalities of gait and mobility (R26.89);Pain Pain - part of body: (epigastric)    Time: 1586-8257 PT Time Calculation (min) (ACUTE ONLY): 15 min   Charges:   PT Evaluation $PT Eval Low Complexity: 1 Low     PT G Codes:        Reinaldo Berber, PT, DPT Acute Rehab Services Pager: [phone]    Reinaldo Berber 06/28/2017, 10:13 AM

## 2017-06-28 NOTE — Progress Notes (Addendum)
Subjective: Feeling very well.  The pain has markedly improved after one dose yesterday.  Objective: Vital signs in last 24 hours: Temp:  [98.5 F (36.9 C)-99.2 F (37.3 C)] 98.5 F (36.9 C) (01/11 0529) Pulse Rate:  [66-109] 85 (01/11 0529) Resp:  [13-18] 15 (01/11 0529) BP: (102-124)/(54-67) 102/54 (01/11 0529) SpO2:  [97 %-100 %] 97 % (01/11 0529) Last BM Date: 06/27/17  Intake/Output from previous day: 01/10 0701 - 01/11 0700 In: 2612.8 [P.O.:362; I.V.:1850.8; IV Piggyback:400] Out: 1 [Urine:1] Intake/Output this shift: No intake/output data recorded.  General appearance: alert and no distress GI: minimal  Lab Results: Recent Labs    06/27/17 0614  WBC 7.5  HGB 14.5  HCT 41.9  PLT 340   BMET Recent Labs    06/26/17 0432 06/26/17 1408  NA 135 136  K 3.9 3.8  CL 98* 100*  CO2 28 29  GLUCOSE 109* 98  BUN 5* <5*  CREATININE 0.74 0.86  CALCIUM 9.0 8.8*   LFT Recent Labs    06/26/17 1408  PROT 6.5  ALBUMIN 3.6  AST 34  ALT 39  ALKPHOS 46  BILITOT 1.2   PT/INR No results for input(s): LABPROT, INR in the last 72 hours. Hepatitis Panel No results for input(s): HEPBSAG, HCVAB, HEPAIGM, HEPBIGM in the last 72 hours. C-Diff No results for input(s): CDIFFTOX in the last 72 hours. Fecal Lactopherrin No results for input(s): FECLLACTOFRN in the last 72 hours.  Studies/Results: Ct Abdomen Pelvis Wo Contrast  Result Date: 06/27/2017 CLINICAL DATA:  Abdominal pain and fever. Abscess suspected. Terminal ileitis. Question abscess. EXAM: CT ABDOMEN AND PELVIS WITHOUT CONTRAST TECHNIQUE: Multidetector CT imaging of the abdomen and pelvis was performed following the standard protocol without IV contrast. COMPARISON:  MRI of the abdomen 06/24/2016. CT of the abdomen and pelvis 06/23/2017. FINDINGS: Lower chest: The lung bases are clear without focal nodule, mass, or airspace disease. Heart size is normal. No significant pleural or pericardial effusion is present.  Hepatobiliary: Mild periportal edema is improved. No mass lesion is present. There is no significant duct dilation. Cholecystectomy is noted. The common bile duct is unremarkable. Pancreas: Unremarkable. No pancreatic ductal dilatation or surrounding inflammatory changes. Spleen: Normal in size without focal abnormality. Adrenals/Urinary Tract: Adrenal glands and kidneys are within normal limits. The ureters and urinary bladder are unremarkable. Stomach/Bowel: Stomach and duodenum are within normal limits. Previously noted inflammatory changes of the terminal ileum have significantly improved. There is no discrete abscess. Oral contrast is noted within the colon. The appendix is normal. The ascending and transverse colon are within normal limits. Descending and sigmoid colon are unremarkable. Vascular/Lymphatic: No significant vascular findings are present. No enlarged abdominal or pelvic lymph nodes. Reproductive: Uterus and bilateral adnexa are unremarkable. Other: No abdominal wall hernia or abnormality. No abdominopelvic ascites. Musculoskeletal: Vertebral body heights and alignment are normal. Facet degenerative changes are noted L4-5 and L5-S1 bilaterally. The bony pelvis is intact. The hips are located and within normal limits bilaterally. IMPRESSION: 1. Previously noted inflammatory changes of the terminal ileum have significantly improved and essentially resolved. There is no abscess. 2. The improved periportal edema of the liver without a discrete lesion or obstruction. 3. No other acute or focal abnormality to explain ongoing symptoms. Electronically Signed   By: San Morelle M.D.   On: 06/27/2017 11:41    Medications:  Scheduled: . baclofen  5 mg Oral TID  . docusate sodium  100 mg Oral BID  . methylPREDNISolone (SOLU-MEDROL) injection  40  mg Intravenous Daily  .  morphine injection  2 mg Intravenous Q4H  . pantoprazole (PROTONIX) IV  40 mg Intravenous Q12H   Continuous: .  ciprofloxacin Stopped (06/28/17 0414)  . lactated ringers 75 mL/hr at 06/28/17 0638  . ondansetron (ZOFRAN) IV Stopped (06/26/17 0222)    Assessment/Plan: 1) Back pain. 2) Epigastric pain.   My presumption is that she had musculoskeletal pain.  She has made a significant improvement with the Solumedrol.    Plan: 1) D/C cipro. 2) Okay to D/C home. 3) Treat with a Medrol Dose Pack (6 day course.  #21 tabs) and Baclofen. 4) She can follow up with PCP.  LOS: 3 days   Jayleon Mcfarlane D 06/28/2017, 10:35 AM

## 2017-06-28 NOTE — Discharge Summary (Addendum)
Discharge Summary  Maureen Ballard IRW:[id number] DOB: [birth year]  PCP: Jani Gravel, MD  Admit date: 06/24/2017 Discharge date: 06/28/2017  Time spent: 25 minutes  Recommendations for Outpatient Follow-up:  1. Follow up with your PCP within a week 2. Take your medications as prescribed EGD 06/25/17 stomach biopsy results: Stomach, biopsy, Antrum and Body - REACTIVE GASTROPATHY. - THERE IS NO EVIDENCE OF HELICOBACTER PYLORI, DYSPLASIA OR MALIGNANCY.  Discharge Diagnoses:  Active Hospital Problems   Diagnosis Date Noted  . Abdominal pain 06/24/2017  . Intractable nausea and vomiting 06/25/2017    Resolved Hospital Problems  No resolved problems to display.    Discharge Condition: stable  Diet recommendation: resume previous diet  Vitals:   06/28/17 0529 06/28/17 1315  BP: (!) 102/54 (!) 115/53  Pulse: 85 79  Resp: 15 16  Temp: 98.5 F (36.9 C) 98.8 F (37.1 C)  SpO2: 97% 100%    History of present illness:  Maureen Ballard is a 37 y.o. female with no chronic medical conditions who presented to the ED with severe epigastric pain x 5 days associated with nausea, emesis, and inability to tolerate oral intake. CT abd pelvis w contrast 06/23/17 revealed periportal hepatic edema and little pelvic free fluid. Discharged from the ED with normal labs, prescribed PPI. Symptoms worsened and returned to the ED where abdominal series normal and abd U/S again showed periportal edema, prominent hepatic veins and IVC w/slightly dilated intrahepatic ducts and distal CBD consistent with h/o cholecystectomy. MRI 06/24/17 revealed wall thickening and mucosal hyperenhancement in the terminal ileum question infectious versus inflammatory ileitis, slightly but dilated bile duct at 7 mm consistent with post cholecystectomy status with no evidence of choledocholithiasis. Again nonspecific mild periportal edema was noted. GI consulted and following. EGD 06/25/17 showed normal esophagus, stomach, and  examined duodenum.   On 06/27/17 repeat CT abd pelvis wo contrast showed marked improvement of previously suspected ileitis. Patient was also started on IV solumedrol and baclofen with almost immediate resolution of her symptoms, abdominal pain, back pain, and nausea.   There were no events overnight and patient has not requested pain medications.  On the day of discharge, the patient was hemodynamically stable and tolerated a regular diet. Patient will need to follow up with her PCP post hospitalization.   Hospital Course:  Principal Problem:   Abdominal pain Active Problems:   Intractable nausea and vomiting  Intractable N/V, abdominal pain with suspected ileitis, resolved - repeat CT abd pelvis no contrast revealed marked improvement of suspected ileitis  - Normal LFTs.  - EGD negative for ulcer - biopsy results as stated above - Symptoms resolved  - tolerating po well - EGD 06/25/17 stomach biopsy results: Stomach, biopsy, Antrum and Body - REACTIVE GASTROPATHY. - THERE IS NO EVIDENCE OF HELICOBACTER PYLORI, DYSPLASIA OR MALIGNANCY.  Intractable back pain, resolved -possibly from spasm -IV solumedrol -baclofen -for outpatient management, baclofen and po medrol pack x 6 days  Procedures:  EGD 06/25/17  Consultations:  GI  Discharge Exam: BP (!) 115/53 (BP Location: Right Arm)   Pulse 79   Temp 98.8 F (37.1 C) (Oral)   Resp 16   Ht 5' 9"  (1.753 m)   Wt 99.8 kg (220 lb)   SpO2 100%   BMI 32.49 kg/m   General: 37 yo CF WD WN NAD A&O x3 Cardiovascular: RRR no rubs or gallops Respiratory: CTA no wheezes or rales  Abdominal: ND NT NBS x4  Discharge Instructions You were cared for by a  hospitalist during your hospital stay. If you have any questions about your discharge medications or the care you received while you were in the hospital after you are discharged, you can call the unit and asked to speak with the hospitalist on call if the hospitalist that took care  of you is not available. Once you are discharged, your primary care physician will handle any further medical issues. Please note that NO REFILLS for any discharge medications will be authorized once you are discharged, as it is imperative that you return to your primary care physician (or establish a relationship with a primary care physician if you do not have one) for your aftercare needs so that they can reassess your need for medications and monitor your lab values.   Allergies as of 06/28/2017      Reactions   Hydrocodone Nausea And Vomiting      Medication List    STOP taking these medications   oxyCODONE-acetaminophen 5-325 MG tablet Commonly known as:  PERCOCET/ROXICET     TAKE these medications   Baclofen 5 MG Tabs Take 5 mg by mouth 3 (three) times daily.   methylPREDNISolone 2 MG tablet Commonly known as:  MEDROL Take 1 tablet (2 mg total) by mouth daily for 3 days, THEN 1 tablet (2 mg total) daily for 3 days. Start taking on:  06/28/2017   omeprazole 20 MG capsule Commonly known as:  PRILOSEC Take 1 capsule (20 mg total) by mouth daily.   sucralfate 1 GM/10ML suspension Commonly known as:  CARAFATE Take 10 mLs (1 g total) by mouth 4 (four) times daily -  with meals and at bedtime.      Allergies  Allergen Reactions  . Hydrocodone Nausea And Vomiting   Follow-up Information    Jani Gravel, MD Follow up.   Specialty:  Internal Medicine Contact information: [address] [place] [address] [phone]            The results of significant diagnostics from this hospitalization (including imaging, microbiology, ancillary and laboratory) are listed below for reference.    Significant Diagnostic Studies: Ct Abdomen Pelvis Wo Contrast  Result Date: 06/27/2017 CLINICAL DATA:  Abdominal pain and fever. Abscess suspected. Terminal ileitis. Question abscess. EXAM: CT ABDOMEN AND PELVIS WITHOUT CONTRAST TECHNIQUE: Multidetector CT imaging of  the abdomen and pelvis was performed following the standard protocol without IV contrast. COMPARISON:  MRI of the abdomen 06/24/2016. CT of the abdomen and pelvis 06/23/2017. FINDINGS: Lower chest: The lung bases are clear without focal nodule, mass, or airspace disease. Heart size is normal. No significant pleural or pericardial effusion is present. Hepatobiliary: Mild periportal edema is improved. No mass lesion is present. There is no significant duct dilation. Cholecystectomy is noted. The common bile duct is unremarkable. Pancreas: Unremarkable. No pancreatic ductal dilatation or surrounding inflammatory changes. Spleen: Normal in size without focal abnormality. Adrenals/Urinary Tract: Adrenal glands and kidneys are within normal limits. The ureters and urinary bladder are unremarkable. Stomach/Bowel: Stomach and duodenum are within normal limits. Previously noted inflammatory changes of the terminal ileum have significantly improved. There is no discrete abscess. Oral contrast is noted within the colon. The appendix is normal. The ascending and transverse colon are within normal limits. Descending and sigmoid colon are unremarkable. Vascular/Lymphatic: No significant vascular findings are present. No enlarged abdominal or pelvic lymph nodes. Reproductive: Uterus and bilateral adnexa are unremarkable. Other: No abdominal wall hernia or abnormality. No abdominopelvic ascites. Musculoskeletal: Vertebral body heights and alignment are  normal. Facet degenerative changes are noted L4-5 and L5-S1 bilaterally. The bony pelvis is intact. The hips are located and within normal limits bilaterally. IMPRESSION: 1. Previously noted inflammatory changes of the terminal ileum have significantly improved and essentially resolved. There is no abscess. 2. The improved periportal edema of the liver without a discrete lesion or obstruction. 3. No other acute or focal abnormality to explain ongoing symptoms. Electronically Signed    By: San Morelle M.D.   On: 06/27/2017 11:41   Ct Abdomen Pelvis W Contrast  Result Date: 06/23/2017 CLINICAL DATA:  Epigastric pain for several days. Vomiting yesterday. EXAM: CT ABDOMEN AND PELVIS WITH CONTRAST TECHNIQUE: Multidetector CT imaging of the abdomen and pelvis was performed using the standard protocol following bolus administration of intravenous contrast. CONTRAST:  1101m ISOVUE-300 IOPAMIDOL (ISOVUE-300) INJECTION 61% COMPARISON:  None. FINDINGS: Lower chest: Lung bases are clear. Hepatobiliary: Mild periportal edema. This can represent normal variation but can also be associated with hepatitis. No focal liver abnormality is seen. Status post cholecystectomy. No biliary dilatation. Pancreas: Unremarkable. No pancreatic ductal dilatation or surrounding inflammatory changes. Spleen: Normal in size without focal abnormality. Adrenals/Urinary Tract: Adrenal glands are unremarkable. Kidneys are normal, without renal calculi, focal lesion, or hydronephrosis. Bladder is unremarkable. Stomach/Bowel: Stomach is within normal limits. Appendix appears normal. No evidence of bowel wall thickening, distention, or inflammatory changes. Vascular/Lymphatic: No significant vascular findings are present. No enlarged abdominal or pelvic lymph nodes. Reproductive: Uterus and bilateral adnexa are unremarkable. Small amount of free fluid in the pelvis is probably physiologic. Other: No abdominal wall hernia or abnormality. No abdominopelvic ascites. Musculoskeletal: No acute or significant osseous findings. IMPRESSION: 1. Mild periportal hepatic edema. This may represent normal variation but can be associated with hepatitis. No hepatic abscess or focal lesion. 2. No evidence of bowel obstruction or inflammation. 3. Small amount of free fluid in the pelvis is likely physiologic. Electronically Signed   By: WLucienne CapersM.D.   On: 06/23/2017 03:37   Mr 3d Recon At Scanner  Result Date: 06/24/2017 CLINICAL  DATA:  Epigastric abdominal pain and vomiting. History of cholecystectomy. Nonspecific periportal edema on CT and ultrasound. EXAM: MRI ABDOMEN WITHOUT AND WITH CONTRAST (INCLUDING MRCP) TECHNIQUE: Multiplanar multisequence MR imaging of the abdomen was performed both before and after the administration of intravenous contrast. Heavily T2-weighted images of the biliary and pancreatic ducts were obtained, and three-dimensional MRCP images were rendered by post processing. CONTRAST:  242mMULTIHANCE GADOBENATE DIMEGLUMINE 529 MG/ML IV SOLN COMPARISON:  06/24/2017 abdominal sonogram. 06/23/2017 CT abdomen/ pelvis. FINDINGS: Lower chest: No acute abnormality at the lung bases. Hepatobiliary: Normal liver size and configuration. No hepatic steatosis. No liver mass. Cholecystectomy. There is mild nonspecific periportal edema throughout the liver, most prominent in the anterior right lower lobe. Bile ducts are within normal post cholecystectomy limits. Common bile duct diameter 7 mm. No evidence of choledocholithiasis or biliary mass. Pancreas: No pancreatic mass. No pancreatic duct dilation. Pancreatic duct is diminutive and not well visualized, precluding assessment for pancreas divisum. No significant peripancreatic edema or fluid collections. Spleen: Normal size. No mass. Adrenals/Urinary Tract: Normal adrenals. No hydronephrosis. Normal kidneys with no renal mass. Stomach/Bowel: Grossly normal stomach. Normal caliber visualized small and large bowel. There is wall thickening and mucosal hyperenhancement in the terminal ileum (series 12/image 79), not appreciably changed from the CT study from 1 day prior. Vascular/Lymphatic: Normal caliber abdominal aorta. Patent portal, splenic, hepatic and renal veins. No pathologically enlarged lymph nodes in the abdomen. Other:  No abdominal ascites or focal fluid collection. Musculoskeletal: No aggressive appearing focal osseous lesions. IMPRESSION: 1. Wall thickening and mucosal  hyperenhancement in the terminal ileum, compatible with a nonspecific infectious or inflammatory terminal ileitis. Differential includes Crohn disease. Consider GI consultation, as clinically warranted. 2. Bile ducts are within normal post cholecystectomy limits. CBD diameter 7 mm. No choledocholithiasis. 3. Nonspecific mild periportal edema in the liver. This is commonly due to IV fluid resuscitation in the ER setting. Electronically Signed   By: Ilona Sorrel M.D.   On: 06/24/2017 12:44   Dg Abdomen Acute W/chest  Result Date: 06/24/2017 CLINICAL DATA:  Epigastric pain with nausea and vomiting for the past 3 days. EXAM: DG ABDOMEN ACUTE W/ 1V CHEST COMPARISON:  CT abdomen pelvis dated June 23 2017. Chest x-ray dated March 01, 2014. FINDINGS: There is no evidence of dilated bowel loops or free intraperitoneal air. Fall contrast within the colon. No radiopaque calculi or other significant radiographic abnormality is seen. Prior cholecystectomy. Heart size and mediastinal contours are within normal limits. Both lungs are clear. IMPRESSION: 1. Negative abdominal radiographs. 2. No acute cardiopulmonary disease. Electronically Signed   By: Titus Dubin M.D.   On: 06/24/2017 10:16   Mr Abdomen Mrcp W Wo Contast  Result Date: 06/24/2017 CLINICAL DATA:  Epigastric abdominal pain and vomiting. History of cholecystectomy. Nonspecific periportal edema on CT and ultrasound. EXAM: MRI ABDOMEN WITHOUT AND WITH CONTRAST (INCLUDING MRCP) TECHNIQUE: Multiplanar multisequence MR imaging of the abdomen was performed both before and after the administration of intravenous contrast. Heavily T2-weighted images of the biliary and pancreatic ducts were obtained, and three-dimensional MRCP images were rendered by post processing. CONTRAST:  24m MULTIHANCE GADOBENATE DIMEGLUMINE 529 MG/ML IV SOLN COMPARISON:  06/24/2017 abdominal sonogram. 06/23/2017 CT abdomen/ pelvis. FINDINGS: Lower chest: No acute abnormality at the lung  bases. Hepatobiliary: Normal liver size and configuration. No hepatic steatosis. No liver mass. Cholecystectomy. There is mild nonspecific periportal edema throughout the liver, most prominent in the anterior right lower lobe. Bile ducts are within normal post cholecystectomy limits. Common bile duct diameter 7 mm. No evidence of choledocholithiasis or biliary mass. Pancreas: No pancreatic mass. No pancreatic duct dilation. Pancreatic duct is diminutive and not well visualized, precluding assessment for pancreas divisum. No significant peripancreatic edema or fluid collections. Spleen: Normal size. No mass. Adrenals/Urinary Tract: Normal adrenals. No hydronephrosis. Normal kidneys with no renal mass. Stomach/Bowel: Grossly normal stomach. Normal caliber visualized small and large bowel. There is wall thickening and mucosal hyperenhancement in the terminal ileum (series 12/image 79), not appreciably changed from the CT study from 1 day prior. Vascular/Lymphatic: Normal caliber abdominal aorta. Patent portal, splenic, hepatic and renal veins. No pathologically enlarged lymph nodes in the abdomen. Other: No abdominal ascites or focal fluid collection. Musculoskeletal: No aggressive appearing focal osseous lesions. IMPRESSION: 1. Wall thickening and mucosal hyperenhancement in the terminal ileum, compatible with a nonspecific infectious or inflammatory terminal ileitis. Differential includes Crohn disease. Consider GI consultation, as clinically warranted. 2. Bile ducts are within normal post cholecystectomy limits. CBD diameter 7 mm. No choledocholithiasis. 3. Nonspecific mild periportal edema in the liver. This is commonly due to IV fluid resuscitation in the ER setting. Electronically Signed   By: JIlona SorrelM.D.   On: 06/24/2017 12:44   [place]domen Limited Ruq  Result Date: 06/24/2017 CLINICAL DATA:  37year old female with right upper quadrant pain and abnormal liver function tests. Post cholecystectomy.  Subsequent encounter. EXAM: ULTRASOUND ABDOMEN LIMITED RIGHT UPPER QUADRANT COMPARISON:  06/23/2017 CT. FINDINGS: Gallbladder: Surgically removed. Common bile duct: Diameter: 3 mm proximally and 6.9 mm distally. Liver: Mild prominence central hepatic ducts. Mild periportal edema. Prominence of the hepatic vein confluence and inferior vena cava. Portal vein is patent on color Doppler imaging with normal direction of blood flow towards the liver. IMPRESSION: Nonspecific periportal edema. The additional finding of prominence of the hepatic veins and inferior vena cava raises possibility of increased right heart pressure (atypical for patient's age). Alternatively, periportal edema could be related to hepatitis in the proper clinical setting. Post cholecystectomy. The slight prominence of the central intrahepatic biliary ducts and distal common bile duct most likely related to post cholecystectomy state. No obstructing common bile duct stone detected. If patient had persistent elevated liver function studies not explained by hepatitis or other abnormality then follow-up MRCP/liver MR could be obtained for further delineation if clinically desired. Electronically Signed   By: Genia Del M.D.   On: 06/24/2017 10:37    Microbiology: No results found for this or any previous visit (from the past 240 hour(s)).   Labs: Basic Metabolic Panel: Recent Labs  Lab 06/23/17 0100 06/24/17 0800 06/25/17 0817 06/26/17 0432 06/26/17 1408  NA 136 135 134* 135 136  K 3.7 3.8 3.4* 3.9 3.8  CL 102 101 100* 98* 100*  CO2 25 24 25 28 29   GLUCOSE 105* 112* 83 109* 98  BUN 10 6 7  5* <5*  CREATININE 0.82 0.70 0.69 0.74 0.86  CALCIUM 9.0 8.9 8.7* 9.0 8.8*   Liver Function Tests: Recent Labs  Lab 06/23/17 0100 06/24/17 0800 06/26/17 1408  AST 37 35 34  ALT 39 38 39  ALKPHOS 50 44 46  BILITOT 0.6 1.1 1.2  PROT 6.9 6.6 6.5  ALBUMIN 4.1 3.9 3.6   Recent Labs  Lab 06/23/17 0100 06/24/17 0800  LIPASE 26 28    No results for input(s): AMMONIA in the last 168 hours. CBC: Recent Labs  Lab 06/23/17 0100 06/24/17 0800 06/25/17 0817 06/27/17 0614  WBC 5.7 6.0 7.1 7.5  HGB 13.5 12.6 12.1 14.5  HCT 38.2 37.9 37.4 41.9  MCV 90.5 90.9 92.3 91.3  PLT 344 300 273 340   Cardiac Enzymes: No results for input(s): CKTOTAL, CKMB, CKMBINDEX, TROPONINI in the last 168 hours. BNP: BNP (last 3 results) No results for input(s): BNP in the last 8760 hours.  ProBNP (last 3 results) No results for input(s): PROBNP in the last 8760 hours.  CBG: No results for input(s): GLUCAP in the last 168 hours.     Signed:  Kayleen Memos, MD [facility] 06/28/2017, 2:35 PM

## 2017-06-28 NOTE — Progress Notes (Signed)
Pt feeling a lot better today. Tolerated regular diet, denies abd pain. Ambulating in the hall. Discharge instructions given to pt. Discharged home.

## 2017-06-28 NOTE — Discharge Instructions (Signed)
Abdominal Pain, Adult Many things can cause belly (abdominal) pain. Most times, belly pain is not dangerous. Many cases of belly pain can be watched and treated at home. Sometimes belly pain is serious, though. Your doctor will try to find the cause of your belly pain. Follow these instructions at home:  Take over-the-counter and prescription medicines only as told by your doctor. Do not take medicines that help you poop (laxatives) unless told to by your doctor.  Drink enough fluid to keep your pee (urine) clear or pale yellow.  Watch your belly pain for any changes.  Keep all follow-up visits as told by your doctor. This is important. Contact a doctor if:  Your belly pain changes or gets worse.  You are not hungry, or you lose weight without trying.  You are having trouble pooping (constipated) or have watery poop (diarrhea) for more than 2-3 days.  You have pain when you pee or poop.  Your belly pain wakes you up at night.  Your pain gets worse with meals, after eating, or with certain foods.  You are throwing up and cannot keep anything down.  You have a fever. Get help right away if:  Your pain does not go away as soon as your doctor says it should.  You cannot stop throwing up.  Your pain is only in areas of your belly, such as the right side or the left lower part of the belly.  You have bloody or black poop, or poop that looks like tar.  You have very bad pain, cramping, or bloating in your belly.  You have signs of not having enough fluid or water in your body (dehydration), such as: ? Dark pee, very little pee, or no pee. ? Cracked lips. ? Dry mouth. ? Sunken eyes. ? Sleepiness. ? Weakness. This information is not intended to replace advice given to you by your health care provider. Make sure you discuss any questions you have with your health care provider. Document Released: 11/21/2007 Document Revised: 12/23/2015 Document Reviewed: 11/16/2015 Elsevier  Interactive Patient Education  2018 [employer].

## 2017-07-10 DIAGNOSIS — R1013 Epigastric pain: Secondary | ICD-10-CM | POA: Diagnosis not present

## 2017-07-10 DIAGNOSIS — M549 Dorsalgia, unspecified: Secondary | ICD-10-CM | POA: Diagnosis not present

## 2017-07-24 DIAGNOSIS — R109 Unspecified abdominal pain: Secondary | ICD-10-CM | POA: Diagnosis not present

## 2017-07-25 DIAGNOSIS — M722 Plantar fascial fibromatosis: Secondary | ICD-10-CM | POA: Diagnosis not present

## 2017-07-25 DIAGNOSIS — M792 Neuralgia and neuritis, unspecified: Secondary | ICD-10-CM | POA: Diagnosis not present

## 2017-07-25 DIAGNOSIS — M7742 Metatarsalgia, left foot: Secondary | ICD-10-CM | POA: Diagnosis not present

## 2017-07-27 ENCOUNTER — Other Ambulatory Visit: Payer: Self-pay | Admitting: Internal Medicine

## 2017-07-27 DIAGNOSIS — M25512 Pain in left shoulder: Secondary | ICD-10-CM

## 2017-08-03 ENCOUNTER — Ambulatory Visit
Admission: RE | Admit: 2017-08-03 | Discharge: 2017-08-03 | Disposition: A | Discharge status: Home / Self Care | Payer: 59 | Source: Ambulatory Visit | Attending: Internal Medicine | Admitting: Internal Medicine

## 2017-08-03 DIAGNOSIS — M19012 Primary osteoarthritis, left shoulder: Secondary | ICD-10-CM | POA: Diagnosis not present

## 2017-08-03 DIAGNOSIS — M25512 Pain in left shoulder: Secondary | ICD-10-CM

## 2017-08-21 DIAGNOSIS — R109 Unspecified abdominal pain: Secondary | ICD-10-CM | POA: Diagnosis not present

## 2017-08-21 DIAGNOSIS — G8929 Other chronic pain: Secondary | ICD-10-CM | POA: Diagnosis not present

## 2017-08-21 DIAGNOSIS — M25512 Pain in left shoulder: Secondary | ICD-10-CM | POA: Diagnosis not present

## 2017-09-10 ENCOUNTER — Ambulatory Visit: Payer: Self-pay | Admitting: Sports Medicine

## 2017-09-13 DIAGNOSIS — I8311 Varicose veins of right lower extremity with inflammation: Secondary | ICD-10-CM | POA: Diagnosis not present

## 2017-09-13 DIAGNOSIS — I83813 Varicose veins of bilateral lower extremities with pain: Secondary | ICD-10-CM | POA: Diagnosis not present

## 2017-09-13 DIAGNOSIS — I83893 Varicose veins of bilateral lower extremities with other complications: Secondary | ICD-10-CM | POA: Diagnosis not present

## 2017-09-13 DIAGNOSIS — I83811 Varicose veins of right lower extremities with pain: Secondary | ICD-10-CM | POA: Diagnosis not present

## 2017-09-17 ENCOUNTER — Ambulatory Visit: Payer: 59 | Admitting: Sports Medicine

## 2017-09-17 ENCOUNTER — Encounter: Payer: Self-pay | Admitting: Sports Medicine

## 2017-09-17 VITALS — BP 127/76 | HR 66 | Ht 69.0 in | Wt 220.0 lb

## 2017-09-17 DIAGNOSIS — M25512 Pain in left shoulder: Secondary | ICD-10-CM

## 2017-09-17 DIAGNOSIS — I8311 Varicose veins of right lower extremity with inflammation: Secondary | ICD-10-CM | POA: Diagnosis not present

## 2017-09-17 DIAGNOSIS — G8929 Other chronic pain: Secondary | ICD-10-CM

## 2017-09-17 DIAGNOSIS — I83811 Varicose veins of right lower extremities with pain: Secondary | ICD-10-CM | POA: Diagnosis not present

## 2017-09-17 MED ORDER — AMITRIPTYLINE HCL 25 MG PO TABS: 25.0000 mg | ORAL_TABLET | Freq: Every day | ORAL | 1 refills | Status: DC

## 2017-09-17 MED ORDER — NITROGLYCERIN 0.2 MG/HR TD PT24: MEDICATED_PATCH | TRANSDERMAL | 0 refills | Status: DC

## 2017-09-17 NOTE — Patient Instructions (Addendum)
Nitroglycerin Protocol   Apply 1/4 nitroglycerin patch to affected area daily.  Change position of patch within the affected area every 24 hours.  You may experience a headache during the first 1-2 weeks of using the patch, these should subside.  If you experience headaches after beginning nitroglycerin patch treatment, you may take your preferred over the counter pain reliever.  Another side effect of the nitroglycerin patch is skin irritation or rash related to patch adhesive.  Please notify our office if you develop more severe headaches or rash, and stop the patch.  Tendon healing with nitroglycerin patch may require 12 to 24 weeks depending on the extent of injury.  Men should not use if taking Viagra, Cialis, or Levitra.   Do not use if you have migraines or rosacea.   It was great seeing you today! We have addressed the following issues today  1. We are starting you on amitriptyline 25 mg at night to help relax muscle in the shoulder. 2. We are also starting you on a nitroglycerin patch which will with blood flood in the area. (see above for side effects) 3. As discussed want you to do those strengthening exercising internal and external rotation with light weight dumbells. We also want you to do spoke of the wheel exercise 10 in each position and work yourself help to three sets. 4. Follow up with [place] in clinic in 6 weeks.  If we did any lab work today, and the results require attention, either me or my nurse will get in touch with you. If everything is normal, you will get a letter in mail and a message via . If you don't hear from [place] in two weeks, please give [place] a call. Otherwise, we look forward to seeing you again at your next visit. If you have any questions or concerns before then, please call the clinic at [phone].  Please bring all your medications to every doctors visit  Sign up for My Chart to have easy access to your labs results, and communication with your  Primary care physician. Please ask Front Desk for some assistance.   Please check-out at the front desk before leaving the clinic.    Take Care,   Dr. Andy Gauss

## 2017-09-17 NOTE — Assessment & Plan Note (Addendum)
Patient presents with left shoulder pain evaluation at the request of [name] (PCP). 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 Patient also with history of capsulitis which has resolved with PT. Patient  continue to have pain in shoulder. 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 On exam, patient has noted weakness of the right shoulder. Pain has been bothering but not limiting her in her activities including exercise. Plan to conservatively manage symptoms as a first step in our approach and if no significant changes are noted would refer to [facility] ( Ortho Surgery) for further evaluation. --Start patient on Amitriptyline 25 mg daily to help with spasms. --Start patient on nitroglycerin patient to promote blood flow --Specific strenthening exercises provided for shoulder weakness --Patient will follow up in 6 weeks for reeevaluation

## 2017-09-17 NOTE — Progress Notes (Signed)
a 
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
## 2017-09-24 DIAGNOSIS — I83812 Varicose veins of left lower extremities with pain: Secondary | ICD-10-CM | POA: Diagnosis not present

## 2017-09-24 DIAGNOSIS — I83892 Varicose veins of left lower extremities with other complications: Secondary | ICD-10-CM | POA: Diagnosis not present

## 2017-10-01 DIAGNOSIS — R933 Abnormal findings on diagnostic imaging of other parts of digestive tract: Secondary | ICD-10-CM | POA: Diagnosis not present

## 2017-10-01 DIAGNOSIS — R1084 Generalized abdominal pain: Secondary | ICD-10-CM | POA: Diagnosis not present

## 2017-10-03 DIAGNOSIS — R1084 Generalized abdominal pain: Secondary | ICD-10-CM | POA: Diagnosis not present

## 2017-10-03 DIAGNOSIS — R933 Abnormal findings on diagnostic imaging of other parts of digestive tract: Secondary | ICD-10-CM | POA: Diagnosis not present

## 2017-10-17 DIAGNOSIS — I8312 Varicose veins of left lower extremity with inflammation: Secondary | ICD-10-CM | POA: Diagnosis not present

## 2017-10-17 DIAGNOSIS — I83812 Varicose veins of left lower extremities with pain: Secondary | ICD-10-CM | POA: Diagnosis not present

## 2017-10-24 DIAGNOSIS — Z01419 Encounter for gynecological examination (general) (routine) without abnormal findings: Secondary | ICD-10-CM | POA: Diagnosis not present

## 2017-10-24 DIAGNOSIS — S2224XA Fracture of xiphoid process, initial encounter for closed fracture: Secondary | ICD-10-CM | POA: Diagnosis not present

## 2017-10-28 DIAGNOSIS — I83812 Varicose veins of left lower extremities with pain: Secondary | ICD-10-CM | POA: Diagnosis not present

## 2017-10-28 DIAGNOSIS — M7981 Nontraumatic hematoma of soft tissue: Secondary | ICD-10-CM | POA: Diagnosis not present

## 2017-10-28 DIAGNOSIS — I8312 Varicose veins of left lower extremity with inflammation: Secondary | ICD-10-CM | POA: Diagnosis not present

## 2017-11-04 DIAGNOSIS — H04123 Dry eye syndrome of bilateral lacrimal glands: Secondary | ICD-10-CM | POA: Diagnosis not present

## 2017-11-04 DIAGNOSIS — H40033 Anatomical narrow angle, bilateral: Secondary | ICD-10-CM | POA: Diagnosis not present

## 2017-11-07 ENCOUNTER — Ambulatory Visit: Payer: Self-pay | Admitting: Sports Medicine

## 2017-11-07 DIAGNOSIS — R0789 Other chest pain: Secondary | ICD-10-CM | POA: Diagnosis not present

## 2017-11-19 ENCOUNTER — Ambulatory Visit: Payer: 59 | Admitting: Sports Medicine

## 2017-11-19 ENCOUNTER — Encounter: Payer: Self-pay | Admitting: Sports Medicine

## 2017-11-19 ENCOUNTER — Other Ambulatory Visit: Payer: Self-pay

## 2017-11-19 DIAGNOSIS — G8929 Other chronic pain: Secondary | ICD-10-CM | POA: Diagnosis not present

## 2017-11-19 DIAGNOSIS — M25512 Pain in left shoulder: Secondary | ICD-10-CM

## 2017-11-19 MED ORDER — NITROGLYCERIN 0.2 MG/HR TD PT24: MEDICATED_PATCH | TRANSDERMAL | 0 refills | Status: DC
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
## 2017-11-19 NOTE — Assessment & Plan Note (Signed)
Still appears to be rotator cuff tendinopathy Improvement with nitroglycerin so we will try cutting the patch to 1/8 We will pretreat with 2 Naprosyn 1 hour before using the nitroglycerin patch Continue on home exercise program  Recheck in approximately 2 months  I advised her that steroid injection is an option but I would like to see what they do for her xiphoid before we plan any injections

## 2017-12-02 DIAGNOSIS — I83811 Varicose veins of right lower extremities with pain: Secondary | ICD-10-CM | POA: Diagnosis not present

## 2017-12-02 DIAGNOSIS — Z0000 Encounter for general adult medical examination without abnormal findings: Secondary | ICD-10-CM | POA: Diagnosis not present

## 2017-12-02 DIAGNOSIS — I8311 Varicose veins of right lower extremity with inflammation: Secondary | ICD-10-CM | POA: Diagnosis not present

## 2017-12-03 DIAGNOSIS — M898X8 Other specified disorders of bone, other site: Secondary | ICD-10-CM | POA: Diagnosis not present

## 2017-12-12 DIAGNOSIS — Z0000 Encounter for general adult medical examination without abnormal findings: Secondary | ICD-10-CM | POA: Diagnosis not present

## 2017-12-12 DIAGNOSIS — M949 Disorder of cartilage, unspecified: Secondary | ICD-10-CM | POA: Diagnosis not present

## 2017-12-13 DIAGNOSIS — I8311 Varicose veins of right lower extremity with inflammation: Secondary | ICD-10-CM | POA: Diagnosis not present

## 2017-12-13 DIAGNOSIS — I83811 Varicose veins of right lower extremities with pain: Secondary | ICD-10-CM | POA: Diagnosis not present

## 2017-12-17 ENCOUNTER — Other Ambulatory Visit: Payer: Self-pay | Admitting: *Deleted

## 2017-12-17 MED ORDER — NITROGLYCERIN 0.2 MG/HR TD PT24: MEDICATED_PATCH | TRANSDERMAL | 1 refills | Status: AC

## 2017-12-23 DIAGNOSIS — R0789 Other chest pain: Secondary | ICD-10-CM | POA: Diagnosis not present

## 2018-01-03 DIAGNOSIS — I83812 Varicose veins of left lower extremities with pain: Secondary | ICD-10-CM | POA: Diagnosis not present

## 2018-01-03 DIAGNOSIS — I8312 Varicose veins of left lower extremity with inflammation: Secondary | ICD-10-CM | POA: Diagnosis not present

## 2018-01-10 ENCOUNTER — Other Ambulatory Visit: Payer: Self-pay | Admitting: Internal Medicine

## 2018-01-10 DIAGNOSIS — M949 Disorder of cartilage, unspecified: Principal | ICD-10-CM

## 2018-01-10 DIAGNOSIS — M899 Disorder of bone, unspecified: Secondary | ICD-10-CM

## 2018-01-22 ENCOUNTER — Ambulatory Visit
Admission: RE | Admit: 2018-01-22 | Discharge: 2018-01-22 | Disposition: A | Discharge status: Home / Self Care | Payer: 59 | Source: Ambulatory Visit | Attending: Internal Medicine | Admitting: Internal Medicine

## 2018-01-22 DIAGNOSIS — M949 Disorder of cartilage, unspecified: Principal | ICD-10-CM

## 2018-01-22 DIAGNOSIS — R911 Solitary pulmonary nodule: Secondary | ICD-10-CM | POA: Diagnosis not present

## 2018-01-22 DIAGNOSIS — M899 Disorder of bone, unspecified: Secondary | ICD-10-CM

## 2018-01-23 DIAGNOSIS — I8311 Varicose veins of right lower extremity with inflammation: Secondary | ICD-10-CM | POA: Diagnosis not present

## 2018-01-23 DIAGNOSIS — I83811 Varicose veins of right lower extremities with pain: Secondary | ICD-10-CM | POA: Diagnosis not present

## 2018-02-05 DIAGNOSIS — I8312 Varicose veins of left lower extremity with inflammation: Secondary | ICD-10-CM | POA: Diagnosis not present

## 2018-02-18 ENCOUNTER — Encounter: Payer: Self-pay | Admitting: Thoracic Surgery (Cardiothoracic Vascular Surgery)

## 2018-02-19 DIAGNOSIS — I8311 Varicose veins of right lower extremity with inflammation: Secondary | ICD-10-CM | POA: Diagnosis not present

## 2018-02-26 ENCOUNTER — Other Ambulatory Visit: Payer: Self-pay | Admitting: *Deleted

## 2018-02-26 ENCOUNTER — Encounter: Payer: Self-pay | Admitting: *Deleted

## 2018-02-26 ENCOUNTER — Institutional Professional Consult (permissible substitution): Payer: 59 | Admitting: Cardiothoracic Surgery

## 2018-02-26 ENCOUNTER — Encounter: Payer: Self-pay | Admitting: Cardiothoracic Surgery

## 2018-02-26 VITALS — BP 116/73 | HR 55 | Resp 20 | Ht 69.0 in | Wt 225.0 lb

## 2018-02-26 DIAGNOSIS — R0789 Other chest pain: Secondary | ICD-10-CM
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
## 2018-03-04 DIAGNOSIS — R0789 Other chest pain: Secondary | ICD-10-CM | POA: Insufficient documentation

## 2018-03-05 DIAGNOSIS — I8312 Varicose veins of left lower extremity with inflammation: Secondary | ICD-10-CM | POA: Diagnosis not present

## 2018-03-10 NOTE — Pre-Procedure Instructions (Signed)
Maureen Ballard  03/10/2018      [id number] [id number]  [id number]: [phone]: [phone]   Your procedure is scheduled on Thursday September 26.  [facility]  Call this number if you have problems the morning of surgery:  [phone]   Remember:  Do not eat or drink after midnight.    Take these medicines the morning of surgery with A SIP OF WATER: NONE  7 days prior to surgery STOP taking any Aspirin(unless otherwise instructed by your surgeon), Aleve, Naproxen, Ibuprofen, Motrin, Advil, Goody's, BC's, all herbal medications, fish oil, and all vitamins     Do not wear jewelry, make-up or nail polish.  Do not wear lotions, powders, or perfumes, or deodorant.  Do not shave 48 hours prior to surgery.  Men may shave face and neck.  Do not bring valuables to the hospital.  [facility] not responsible for any belongings or valuables.  Contacts, dentures or bridgework may not be worn into surgery.  Leave your suitcase in the car.  After surgery it may be brought to your room.  For patients admitted to the hospital, discharge time will be determined by your treatment team.  Patients discharged the day of surgery will not be allowed to drive home.   Special instructions:    [facility]- Preparing For Surgery  Before surgery, you can play an important role. Because skin is not sterile, your skin needs to be as free of germs as possible. You can reduce the number of germs on your skin by washing with CHG (chlorahexidine gluconate) Soap before surgery.  CHG is an antiseptic cleaner which kills germs and bonds with the skin to continue killing germs even after washing.    Oral Hygiene is also important to reduce your risk of infection.  Remember - BRUSH YOUR TEETH THE MORNING OF SURGERY WITH YOUR REGULAR TOOTHPASTE  Please do not use if you have an  allergy to CHG or antibacterial soaps. If your skin becomes reddened/irritated stop using the CHG.  Do not shave (including legs and underarms) for at least 48 hours prior to first CHG shower. It is OK to shave your face.  Please follow these instructions carefully.   1. Shower the NIGHT BEFORE SURGERY and the MORNING OF SURGERY with CHG.   2. If you chose to wash your hair, wash your hair first as usual with your normal shampoo.  3. After you shampoo, rinse your hair and body thoroughly to remove the shampoo.  4. Use CHG as you would any other liquid soap. You can apply CHG directly to the skin and wash gently with a scrungie or a clean washcloth.   5. Apply the CHG Soap to your body ONLY FROM THE NECK DOWN.  Do not use on open wounds or open sores. Avoid contact with your eyes, ears, mouth and genitals (private parts). Wash Face and genitals (private parts)  with your normal soap.  6. Wash thoroughly, paying special attention to the area where your surgery will be performed.  7. Thoroughly rinse your body with warm water from the neck down.  8. DO NOT shower/wash with your normal soap after using and rinsing off the CHG Soap.  9. Pat yourself dry with a CLEAN TOWEL.  10. Wear CLEAN PAJAMAS to bed the night before surgery, wear comfortable clothes the morning of surgery  11. Place CLEAN SHEETS on your bed the night of your first shower and DO NOT SLEEP WITH PETS.    Day of Surgery:  Do not apply any deodorants/lotions.  Please wear clean clothes to the hospital/surgery center.   Remember to brush your teeth WITH YOUR REGULAR TOOTHPASTE.    Please read over the following fact sheets that you were given. Coughing and Deep Breathing, MRSA Information and Surgical Site Infection Prevention

## 2018-03-11 ENCOUNTER — Encounter (HOSPITAL_COMMUNITY)
Admission: RE | Admit: 2018-03-11 | Discharge: 2018-03-11 | Disposition: A | Discharge status: Home / Self Care | Payer: 59 | Source: Ambulatory Visit | Attending: Cardiothoracic Surgery | Admitting: Cardiothoracic Surgery

## 2018-03-11 ENCOUNTER — Other Ambulatory Visit: Payer: Self-pay

## 2018-03-11 ENCOUNTER — Ambulatory Visit (HOSPITAL_COMMUNITY)
Admission: RE | Admit: 2018-03-11 | Discharge: 2018-03-11 | Disposition: A | Discharge status: Home / Self Care | Payer: 59 | Source: Ambulatory Visit | Attending: Cardiothoracic Surgery | Admitting: Cardiothoracic Surgery

## 2018-03-11 ENCOUNTER — Encounter (HOSPITAL_COMMUNITY): Payer: Self-pay

## 2018-03-11 DIAGNOSIS — R0789 Other chest pain: Secondary | ICD-10-CM | POA: Insufficient documentation

## 2018-03-11 DIAGNOSIS — Z01818 Encounter for other preprocedural examination: Secondary | ICD-10-CM | POA: Diagnosis not present

## 2018-03-11 HISTORY — DX: Cardiac murmur, unspecified: R01.1

## 2018-03-11 LAB — URINALYSIS, ROUTINE W REFLEX MICROSCOPIC
Bilirubin Urine: NEGATIVE
Glucose, UA: NEGATIVE mg/dL
Hgb urine dipstick: NEGATIVE
Ketones, ur: NEGATIVE mg/dL
Leukocytes, UA: NEGATIVE
Nitrite: NEGATIVE
Protein, ur: NEGATIVE mg/dL
Specific Gravity, Urine: 1.005 (ref 1.005–1.030)
pH: 8 (ref 5.0–8.0)

## 2018-03-11 LAB — CBC
HCT: 39.3 % (ref 36.0–46.0)
Hemoglobin: 12.9 g/dL (ref 12.0–15.0)
MCH: 30.6 pg (ref 26.0–34.0)
MCHC: 32.8 g/dL (ref 30.0–36.0)
MCV: 93.1 fL (ref 78.0–100.0)
Platelets: 282 10*3/uL (ref 150–400)
RBC: 4.22 MIL/uL (ref 3.87–5.11)
RDW: 11.7 % (ref 11.5–15.5)
WBC: 6 10*3/uL (ref 4.0–10.5)

## 2018-03-11 LAB — PROTIME-INR
INR: 0.97
Prothrombin Time: 12.8 seconds (ref 11.4–15.2)

## 2018-03-11 LAB — COMPREHENSIVE METABOLIC PANEL
ALT: 21 U/L (ref 0–44)
AST: 20 U/L (ref 15–41)
Albumin: 3.6 g/dL (ref 3.5–5.0)
Alkaline Phosphatase: 44 U/L (ref 38–126)
Anion gap: 8 (ref 5–15)
BUN: 7 mg/dL (ref 6–20)
CO2: 22 mmol/L (ref 22–32)
Calcium: 9.1 mg/dL (ref 8.9–10.3)
Chloride: 108 mmol/L (ref 98–111)
Creatinine, Ser: 0.76 mg/dL (ref 0.44–1.00)
GFR calc Af Amer: >60 mL/min (ref >60)
GFR calc non Af Amer: >60 mL/min (ref >60)
Glucose, Bld: 93 mg/dL (ref 70–99)
Potassium: 3.9 mmol/L (ref 3.5–5.1)
Sodium: 138 mmol/L (ref 135–145)
Total Bilirubin: 0.6 mg/dL (ref 0.3–1.2)
Total Protein: 6.6 g/dL (ref 6.5–8.1)

## 2018-03-11 LAB — SURGICAL PCR SCREEN
MRSA, PCR: NEGATIVE
Staphylococcus aureus: NEGATIVE

## 2018-03-11 LAB — ABO/RH: ABO/RH(D): A POS

## 2018-03-11 LAB — APTT: aPTT: 29 seconds (ref 24–36)

## 2018-03-11 LAB — TYPE AND SCREEN
ABO/RH(D): A POS
Antibody Screen: NEGATIVE

## 2018-03-11 NOTE — Progress Notes (Signed)
PCP - Jani Gravel Cardiologist - denies  Chest x-ray - 03/11/2018  EKG - 03/11/2018  ECHO - 03/18/13  Patient denies shortness of breath, fever, cough and chest pain at PAT appointment   Patient verbalized understanding of instructions that were given to them at the PAT appointment. Patient was also instructed that they will need to review over the PAT instructions again at home before surgery.

## 2018-03-12 LAB — BLOOD GAS, ARTERIAL
Acid-base deficit: 0.4 mmol/L (ref 0.0–2.0)
Bicarbonate: 23.2 mmol/L (ref 20.0–28.0)
FIO2: 21
O2 Saturation: 98.8 %
Patient temperature: 98.6
pCO2 arterial: 35 mmHg (ref 32.0–48.0)
pH, Arterial: 7.437 (ref 7.350–7.450)
pO2, Arterial: 118 mmHg — ABNORMAL HIGH (ref 83.0–108.0)

## 2018-03-13 ENCOUNTER — Ambulatory Visit (HOSPITAL_COMMUNITY): Payer: 59 | Admitting: Anesthesiology

## 2018-03-13 ENCOUNTER — Other Ambulatory Visit: Payer: Self-pay

## 2018-03-13 ENCOUNTER — Ambulatory Visit (HOSPITAL_COMMUNITY)
Admission: RE | Admit: 2018-03-13 | Discharge: 2018-03-13 | Disposition: A | Discharge status: Home / Self Care | Payer: 59 | Source: Ambulatory Visit | Attending: Cardiothoracic Surgery | Admitting: Cardiothoracic Surgery

## 2018-03-13 ENCOUNTER — Encounter (HOSPITAL_COMMUNITY): Payer: Self-pay

## 2018-03-13 ENCOUNTER — Encounter (HOSPITAL_COMMUNITY)
Admission: RE | Disposition: A | Discharge status: Home / Self Care | Payer: Self-pay | Source: Ambulatory Visit | Attending: Cardiothoracic Surgery

## 2018-03-13 DIAGNOSIS — M954 Acquired deformity of chest and rib: Secondary | ICD-10-CM | POA: Diagnosis not present

## 2018-03-13 DIAGNOSIS — Z885 Allergy status to narcotic agent status: Secondary | ICD-10-CM | POA: Insufficient documentation

## 2018-03-13 DIAGNOSIS — R072 Precordial pain: Secondary | ICD-10-CM | POA: Diagnosis not present

## 2018-03-13 DIAGNOSIS — R0789 Other chest pain: Secondary | ICD-10-CM

## 2018-03-13 HISTORY — PX: RESECTION OF MEDIASTINAL MASS: SHX6497

## 2018-03-13 LAB — POCT PREGNANCY, URINE: Preg Test, Ur: NEGATIVE

## 2018-03-13 SURGERY — EXCISION, MASS, MEDIASTINUM
Anesthesia: General

## 2018-03-13 MED ORDER — KETOROLAC TROMETHAMINE 30 MG/ML IJ SOLN
INTRAMUSCULAR | Status: AC
  Filled 2018-03-13: qty 1

## 2018-03-13 MED ORDER — GLYCOPYRROLATE PF 0.2 MG/ML IJ SOSY
PREFILLED_SYRINGE | INTRAMUSCULAR | Status: AC
  Filled 2018-03-13: qty 1

## 2018-03-13 MED ORDER — NEOSTIGMINE METHYLSULFATE 3 MG/3ML IV SOSY
PREFILLED_SYRINGE | INTRAVENOUS | Status: AC
  Filled 2018-03-13: qty 3

## 2018-03-13 MED ORDER — SODIUM CHLORIDE 0.9% FLUSH: 3.0000 mL | INTRAVENOUS | Status: DC | PRN

## 2018-03-13 MED ORDER — ACETAMINOPHEN 325 MG PO TABS: 650.0000 mg | ORAL_TABLET | ORAL | Status: DC | PRN

## 2018-03-13 MED ORDER — DEXAMETHASONE SODIUM PHOSPHATE 10 MG/ML IJ SOLN
INTRAMUSCULAR | Status: AC
  Filled 2018-03-13: qty 1

## 2018-03-13 MED ORDER — PROPOFOL 10 MG/ML IV BOLUS
INTRAVENOUS | Status: DC | PRN
  Administered 2018-03-13: 160 mg via INTRAVENOUS

## 2018-03-13 MED ORDER — ACETAMINOPHEN 650 MG RE SUPP: 650.0000 mg | RECTAL | Status: DC | PRN

## 2018-03-13 MED ORDER — LIDOCAINE 2% (20 MG/ML) 5 ML SYRINGE
INTRAMUSCULAR | Status: AC
  Filled 2018-03-13: qty 5

## 2018-03-13 MED ORDER — PROMETHAZINE HCL 25 MG/ML IJ SOLN
6.2500 mg | INTRAMUSCULAR | Status: DC | PRN
  Administered 2018-03-13: 12.5 mg via INTRAVENOUS

## 2018-03-13 MED ORDER — LACTATED RINGERS IV SOLN
INTRAVENOUS | Status: DC
  Administered 2018-03-13: 13:00:00 via INTRAVENOUS

## 2018-03-13 MED ORDER — FENTANYL CITRATE (PF) 100 MCG/2ML IJ SOLN
INTRAMUSCULAR | Status: DC | PRN
  Administered 2018-03-13: 75 ug via INTRAVENOUS
  Administered 2018-03-13: 125 ug via INTRAVENOUS
  Administered 2018-03-13: 50 ug via INTRAVENOUS

## 2018-03-13 MED ORDER — ONDANSETRON HCL 4 MG/2ML IJ SOLN
INTRAMUSCULAR | Status: DC | PRN
  Administered 2018-03-13: 4 mg via INTRAVENOUS

## 2018-03-13 MED ORDER — ONDANSETRON HCL 4 MG/2ML IJ SOLN
INTRAMUSCULAR | Status: AC
  Filled 2018-03-13: qty 2

## 2018-03-13 MED ORDER — ROCURONIUM BROMIDE 50 MG/5ML IV SOSY
PREFILLED_SYRINGE | INTRAVENOUS | Status: AC
  Filled 2018-03-13: qty 5

## 2018-03-13 MED ORDER — GLYCOPYRROLATE PF 0.2 MG/ML IJ SOSY
PREFILLED_SYRINGE | INTRAMUSCULAR | Status: DC | PRN
  Administered 2018-03-13: .5 mg via INTRAVENOUS

## 2018-03-13 MED ORDER — DEXAMETHASONE SODIUM PHOSPHATE 10 MG/ML IJ SOLN
INTRAMUSCULAR | Status: DC | PRN
  Administered 2018-03-13: 10 mg via INTRAVENOUS

## 2018-03-13 MED ORDER — CEFAZOLIN SODIUM-DEXTROSE 2-4 GM/100ML-% IV SOLN
2.0000 g | INTRAVENOUS | Status: AC
  Administered 2018-03-13: 2 g via INTRAVENOUS
  Filled 2018-03-13: qty 100

## 2018-03-13 MED ORDER — HYDROMORPHONE HCL 1 MG/ML IJ SOLN
INTRAMUSCULAR | Status: AC
  Filled 2018-03-13: qty 1

## 2018-03-13 MED ORDER — 0.9 % SODIUM CHLORIDE (POUR BTL) OPTIME
TOPICAL | Status: DC | PRN
  Administered 2018-03-13: 2000 mL

## 2018-03-13 MED ORDER — FENTANYL CITRATE (PF) 250 MCG/5ML IJ SOLN
INTRAMUSCULAR | Status: AC
  Filled 2018-03-13: qty 5

## 2018-03-13 MED ORDER — PROPOFOL 10 MG/ML IV BOLUS
INTRAVENOUS | Status: AC
  Filled 2018-03-13: qty 20

## 2018-03-13 MED ORDER — GLYCOPYRROLATE PF 0.2 MG/ML IJ SOSY
PREFILLED_SYRINGE | INTRAMUSCULAR | Status: AC
  Filled 2018-03-13: qty 3

## 2018-03-13 MED ORDER — SODIUM CHLORIDE 0.9 % IV SOLN: 250.0000 mL | INTRAVENOUS | Status: DC | PRN

## 2018-03-13 MED ORDER — SODIUM CHLORIDE 0.9% FLUSH: 3.0000 mL | Freq: Two times a day (BID) | INTRAVENOUS | Status: DC

## 2018-03-13 MED ORDER — LIDOCAINE 2% (20 MG/ML) 5 ML SYRINGE
INTRAMUSCULAR | Status: DC | PRN
  Administered 2018-03-13: 100 mg via INTRAVENOUS

## 2018-03-13 MED ORDER — SCOPOLAMINE 1 MG/3DAYS TD PT72
MEDICATED_PATCH | TRANSDERMAL | Status: DC | PRN
  Administered 2018-03-13: 1 via TRANSDERMAL

## 2018-03-13 MED ORDER — MEPERIDINE HCL 50 MG/ML IJ SOLN: 6.2500 mg | INTRAMUSCULAR | Status: DC | PRN

## 2018-03-13 MED ORDER — PROMETHAZINE HCL 25 MG/ML IJ SOLN
INTRAMUSCULAR | Status: AC
  Filled 2018-03-13: qty 1

## 2018-03-13 MED ORDER — LACTATED RINGERS IV SOLN: INTRAVENOUS | Status: DC

## 2018-03-13 MED ORDER — NEOSTIGMINE METHYLSULFATE 3 MG/3ML IV SOSY
PREFILLED_SYRINGE | INTRAVENOUS | Status: DC | PRN
  Administered 2018-03-13: 4 mg via INTRAVENOUS

## 2018-03-13 MED ORDER — HYDROMORPHONE HCL 1 MG/ML IJ SOLN
0.2500 mg | INTRAMUSCULAR | Status: DC | PRN
  Administered 2018-03-13 (×4): 0.5 mg via INTRAVENOUS

## 2018-03-13 MED ORDER — MIDAZOLAM HCL 2 MG/2ML IJ SOLN
INTRAMUSCULAR | Status: AC
  Filled 2018-03-13: qty 2

## 2018-03-13 MED ORDER — KETOROLAC TROMETHAMINE 30 MG/ML IJ SOLN
30.0000 mg | Freq: Once | INTRAMUSCULAR | Status: AC
  Administered 2018-03-13: 30 mg via INTRAVENOUS

## 2018-03-13 MED ORDER — MIDAZOLAM HCL 5 MG/5ML IJ SOLN
INTRAMUSCULAR | Status: DC | PRN
  Administered 2018-03-13 (×2): 1 mg via INTRAVENOUS

## 2018-03-13 MED ORDER — ROCURONIUM BROMIDE 10 MG/ML (PF) SYRINGE
PREFILLED_SYRINGE | INTRAVENOUS | Status: DC | PRN
  Administered 2018-03-13: 50 mg via INTRAVENOUS

## 2018-03-13 SURGICAL SUPPLY — 45 items
BLADE SURG 11 STRL SS (BLADE) IMPLANT
CANISTER SUCT 3000ML PPV (MISCELLANEOUS) ×3 IMPLANT
CATH THORACIC 28FR (CATHETERS) IMPLANT
CATH THORACIC 28FR RT ANG (CATHETERS) IMPLANT
CLIP VESOCCLUDE SM WIDE 24/CT (CLIP) IMPLANT
CLOSURE WOUND 1/2 X4 (GAUZE/BANDAGES/DRESSINGS)
CONT SPEC 4OZ CLIKSEAL STRL BL (MISCELLANEOUS) ×3 IMPLANT
DERMABOND ADVANCED (GAUZE/BANDAGES/DRESSINGS)
DERMABOND ADVANCED .7 DNX12 (GAUZE/BANDAGES/DRESSINGS) IMPLANT
DRAIN HEMOVAC 1/8 X 5 (WOUND CARE) IMPLANT
DRAPE LAPAROSCOPIC ABDOMINAL (DRAPES) ×3 IMPLANT
DRSG AQUACEL AG ADV 3.5X 4 (GAUZE/BANDAGES/DRESSINGS) ×3 IMPLANT
ELECT BLADE 4.0 EZ CLEAN MEGAD (MISCELLANEOUS) ×3
ELECT REM PT RETURN 9FT ADLT (ELECTROSURGICAL) ×3
ELECTRODE BLDE 4.0 EZ CLN MEGD (MISCELLANEOUS) ×1 IMPLANT
ELECTRODE REM PT RTRN 9FT ADLT (ELECTROSURGICAL) ×1 IMPLANT
EVACUATOR SILICONE 100CC (DRAIN) IMPLANT
GAUZE SPONGE 4X4 12PLY STRL LF (GAUZE/BANDAGES/DRESSINGS) ×3 IMPLANT
GLOVE BIO SURGEON STRL SZ7.5 (GLOVE) ×6 IMPLANT
GOWN STRL REUS W/ TWL LRG LVL3 (GOWN DISPOSABLE) ×2 IMPLANT
GOWN STRL REUS W/TWL LRG LVL3 (GOWN DISPOSABLE) ×4
HEMOSTAT POWDER SURGIFOAM 1G (HEMOSTASIS) IMPLANT
KIT BASIN OR (CUSTOM PROCEDURE TRAY) ×3 IMPLANT
KIT SUCTION CATH 14FR (SUCTIONS) IMPLANT
KIT TURNOVER KIT B (KITS) ×3 IMPLANT
NS IRRIG 1000ML POUR BTL (IV SOLUTION) ×6 IMPLANT
PACK CHEST (CUSTOM PROCEDURE TRAY) ×3 IMPLANT
PAD ARMBOARD 7.5X6 YLW CONV (MISCELLANEOUS) ×6 IMPLANT
STAPLER VISISTAT 35W (STAPLE) IMPLANT
STRIP CLOSURE SKIN 1/2X4 (GAUZE/BANDAGES/DRESSINGS) IMPLANT
SUT PROLENE 4 0 RB 1 (SUTURE)
SUT PROLENE 4-0 RB1 .5 CRCL 36 (SUTURE) IMPLANT
SUT STEEL 6MS V (SUTURE) ×3 IMPLANT
SUT VIC AB 1 CTX 18 (SUTURE) ×3 IMPLANT
SUT VIC AB 1 CTX 27 (SUTURE) IMPLANT
SUT VIC AB 2-0 CT1 27 (SUTURE) ×2
SUT VIC AB 2-0 CT1 TAPERPNT 27 (SUTURE) ×1 IMPLANT
SUT VIC AB 3-0 MH 27 (SUTURE) IMPLANT
SUT VIC AB 3-0 X1 27 (SUTURE) ×3 IMPLANT
SWABSTICK BENZOIN STERILE (MISCELLANEOUS) IMPLANT
TAPE CLOTH SURG 4X10 WHT LF (GAUZE/BANDAGES/DRESSINGS) ×3 IMPLANT
TAPE UMBILICAL COTTON 1/8X30 (MISCELLANEOUS) IMPLANT
TOWEL GREEN STERILE (TOWEL DISPOSABLE) ×3 IMPLANT
TRAY FOLEY SLVR 14FR TEMP STAT (SET/KITS/TRAYS/PACK) ×3 IMPLANT
WATER STERILE IRR 1000ML POUR (IV SOLUTION) ×3 IMPLANT

## 2018-03-13 NOTE — Anesthesia Postprocedure Evaluation (Signed)
Anesthesia Post Note  Patient: Maureen Ballard  Procedure(s) Performed: RESECTION OF XYPHOID (N/A )     Patient location during evaluation: PACU Anesthesia Type: General Level of consciousness: awake and alert Pain management: pain level controlled Vital Signs Assessment: post-procedure vital signs reviewed and stable Respiratory status: spontaneous breathing, nonlabored ventilation, respiratory function stable and patient connected to nasal cannula oxygen Cardiovascular status: blood pressure returned to baseline and stable Postop Assessment: no apparent nausea or vomiting Anesthetic complications: no    Last Vitals:  Vitals:   03/13/18 1611 03/13/18 1620  BP:  128/75  Pulse:  (!) 55  Resp: 15 15  Temp:    SpO2:  96%               Effie Berkshire
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
## 2018-03-13 NOTE — Brief Op Note (Signed)
03/13/2018  3:01 PM  PATIENT:  Maureen Ballard  37 y.o. female  PRE-OPERATIVE DIAGNOSIS:  XYPHOID PAIN  POST-OPERATIVE DIAGNOSIS:  XYPHOID PAIN  PROCEDURE:  Procedure(s): RESECTION OF XYPHOID (N/A)  SURGEON:  Surgeon(s) and Role:    Ivin Poot, MD - Primary  PHYSICIAN ASSISTANT:   ASSISTANTS: RNFA [place]   ANESTHESIA:   general  EBL:  5 mL   BLOOD ADMINISTERED:none  DRAINS: none   LOCAL MEDICATIONS USED:  NONE  SPECIMEN:  Excision  DISPOSITION OF SPECIMEN:  PATHOLOGY  COUNTS:  YES  TOURNIQUET:  * No tourniquets in log *  DICTATION: .Dragon Dictation  PLAN OF CARE: Discharge to home after PACU  PATIENT DISPOSITION:  PACU - hemodynamically stable.   Delay start of Pharmacological VTE agent (>24hrs) due to surgical blood loss or risk of bleeding: yes

## 2018-03-13 NOTE — Discharge Instructions (Signed)
Keep dressing dry 48 hours No lifting more than 20 lbs until office followup No driving  24 hours Office will call for followup appt

## 2018-03-13 NOTE — Anesthesia Procedure Notes (Signed)
Procedure Name: Intubation Date/Time: 03/13/2018 1:51 PM Performed by: Cleda Daub, CRNA Pre-anesthesia Checklist: Emergency Drugs available, Patient identified, Suction available and Patient being monitored Patient Re-evaluated:Patient Re-evaluated prior to induction Oxygen Delivery Method: Circle system utilized Preoxygenation: Pre-oxygenation with 100% oxygen Induction Type: IV induction Ventilation: Mask ventilation without difficulty and Mask ventilation throughout procedure Laryngoscope Size: Mac and 3 Grade View: Grade I Tube type: Oral Tube size: 7.5 mm Number of attempts: 1 Airway Equipment and Method: Stylet Placement Confirmation: ETT inserted through vocal cords under direct vision,  positive ETCO2 and breath sounds checked- equal and bilateral Secured at: 23 cm Tube secured with: Tape Dental Injury: Teeth and Oropharynx as per pre-operative assessment

## 2018-03-13 NOTE — Op Note (Signed)
NAME: Maureen Ballard, Maureen Ballard MEDICAL RECORD TU:[id number] ACCOUNT [id number] DATE OF BIRTH:12/14/1980 FACILITY: [facility] LOCATION: [facility] PHYSICIAN:PETER VAN TRIGT III, MD  OPERATIVE REPORT  DATE OF PROCEDURE:  03/13/2018  OPERATION:  Excision of the xiphoid process.  SURGEON:  Ivin Poot, MD.  POSTOPERATIVE DIAGNOSIS:  Painful xiphoid process, abnormal length of the xiphoid process on CT scan.  POSTOPERATIVE DIAGNOSIS:  Painful xiphoid process, abnormal length of the xiphoid process on CT scan.  ANESTHESIA:  General  CLINICAL NOTE:  The patient is a very nice 37 year old woman who developed severe pain in the area of the lower sternum and xiphoid after doing a boot camp type high intensity workout, dragging a sandbag across the floor in front of her thorax.  The  patient had been plagued with severe pain for several months and in fact required hospitalization with full GI workup which was without a clear explanation for this pain.  She was seen by her gynecologist who recommended evaluation for possible  xiphodynia.  She was referred for thoracic surgical evaluation.  I examined the patient in the office, reviewed her history and examined her thorax.  She had a convincing story for pain related to the xiphoid, which on CT scan showed an abnormal length  and had been totally calcified and a possible location to be injured during her boot camp intense workout.  For that reason, I agreed to proceed with the removal of her xiphoid process.  She understood that the procedure was being done to help her pain,  but there was clear possibility that this would not influence the pain.  She agreed to proceed.  DESCRIPTION OF PROCEDURE:  The patient was brought to the operating room from preoperative holding where informed consent was documented and all final issues addressed.  She was placed supine on the operating table.  General anesthesia was induced.  She  remained stable.  The chest and abdomen  was prepped and draped as a sterile field.  A proper time-out was performed.  A small incision approximately 2 inches was centered on the lower sternum and xiphoid.  Dissection was carried down through the rectus  sheath and the xiphoid was identified between the costal margins.  It was totally calcified approximately 3-4 cm in length and was deformed so that it was directed up into the rectus sheath at its distal aspect.  I dissected off from the soft tissues,  removed it and smoothed off of the end of the sternal bone at the origin of the xiphoid.  This wound was irrigated.  The peritoneum was not opened or entered.  The rectus sheath was closed with interrupted #1 Vicryl.  Subcutaneous tissue was closed with  a running 2-0 Vicryl and skin was closed with a subcuticular.  A sterile dressing was applied.  The patient was reversed from anesthesia and returned to recovery room in stable condition.  TN/NUANCE  D:03/13/2018 T:03/13/2018 JOB:[id number]

## 2018-03-13 NOTE — Progress Notes (Signed)
Pre Procedure note for inpatients:   Maureen Ballard has been scheduled for Procedure(s): RESECTION OF XYPHOID (N/A) today. The various methods of treatment have been discussed with the patient. After consideration of the risks, benefits and treatment options the patient has consented to the planned procedure.   The patient has been seen and labs reviewed. There are no changes in the patient's condition to prevent proceeding with the planned procedure today.  Recent labs:  Lab Results  Component Value Date   WBC 6.0 03/11/2018   HGB 12.9 03/11/2018   HCT 39.3 03/11/2018   PLT 282 03/11/2018   GLUCOSE 93 03/11/2018   ALT 21 03/11/2018   AST 20 03/11/2018   NA 138 03/11/2018   K 3.9 03/11/2018   CL 108 03/11/2018   CREATININE 0.76 03/11/2018   BUN 7 03/11/2018   CO2 22 03/11/2018   INR 0.97 03/11/2018    Len Childs, MD 03/13/2018 1:38 PM

## 2018-03-13 NOTE — Transfer of Care (Signed)
Immediate Anesthesia Transfer of Care Note  Patient: Maureen Ballard  Procedure(s) Performed: RESECTION OF XYPHOID (N/A )  Patient Location: PACU  Anesthesia Type:General  Level of Consciousness: awake, alert , oriented and patient cooperative  Airway & Oxygen Therapy: Patient Spontanous Breathing and Patient connected to face mask oxygen  Post-op Assessment: Report given to RN and Post -op Vital signs reviewed and stable  Post vital signs: Reviewed and stable  Last Vitals:  Vitals Value Taken Time  BP 140/89 03/13/2018  2:50 PM  Temp    Pulse 67 03/13/2018  2:53 PM  Resp 17 03/13/2018  2:53 PM  SpO2 100 % 03/13/2018  2:53 PM  Vitals shown include unvalidated device data.  Last Pain:  Vitals:   03/13/18 1313  TempSrc:   PainSc: 1       Patients Stated Pain Goal: 0 (26/20/35 5974)  Complications: No apparent anesthesia complications

## 2018-03-13 NOTE — Anesthesia Preprocedure Evaluation (Signed)
Anesthesia Evaluation  Patient identified by MRN, date of birth, ID band Patient awake    Reviewed: Allergy & Precautions, NPO status , Patient's Chart, lab work & pertinent test results  Airway Mallampati: II  TM Distance: >3 FB Neck ROM: Full    Dental  (+) Dental Advisory Given, Teeth Intact   Pulmonary neg pulmonary ROS,    breath sounds clear to auscultation       Cardiovascular hypertension, Pt. on medications + Peripheral Vascular Disease   Rhythm:Regular Rate:Normal     Neuro/Psych negative neurological ROS     GI/Hepatic Neg liver ROS, GERD  Medicated,  Endo/Other  negative endocrine ROS  Renal/GU negative Renal ROS     Musculoskeletal  (+) Arthritis ,   Abdominal (+) - obese,   Peds  Hematology negative hematology ROS (+)   Anesthesia Other Findings   Reproductive/Obstetrics                             Lab Results  Component Value Date   WBC 6.0 03/11/2018   HGB 12.9 03/11/2018   HCT 39.3 03/11/2018   MCV 93.1 03/11/2018   PLT 282 03/11/2018   Lab Results  Component Value Date   CREATININE 0.76 03/11/2018   BUN 7 03/11/2018   NA 138 03/11/2018   K 3.9 03/11/2018   CL 108 03/11/2018   CO2 22 03/11/2018   Lab Results  Component Value Date   INR 0.97 03/11/2018   EKG: sinus bradycardia.  Anesthesia Physical Anesthesia Plan  ASA: II  Anesthesia Plan: General   Post-op Pain Management:    Induction: Intravenous  PONV Risk Score and Plan: 4 or greater and Ondansetron, Dexamethasone, Midazolam, Scopolamine patch - Pre-op and Treatment may vary due to age or medical condition  Airway Management Planned: Oral ETT  Additional Equipment: None  Intra-op Plan:   Post-operative Plan: Extubation in OR  Informed Consent: I have reviewed the patients History and Physical, chart, labs and discussed the procedure including the risks, benefits and alternatives for  the proposed anesthesia with the patient or authorized representative who has indicated his/her understanding and acceptance.   Dental advisory given  Plan Discussed with: CRNA  Anesthesia Plan Comments:         Anesthesia Quick Evaluation

## 2018-03-14 ENCOUNTER — Encounter (HOSPITAL_COMMUNITY): Payer: Self-pay | Admitting: Cardiothoracic Surgery

## 2018-03-20 ENCOUNTER — Other Ambulatory Visit: Payer: Self-pay | Admitting: Cardiothoracic Surgery

## 2018-03-20 ENCOUNTER — Encounter (HOSPITAL_COMMUNITY): Payer: Self-pay | Admitting: Cardiothoracic Surgery

## 2018-03-20 DIAGNOSIS — R0789 Other chest pain: Secondary | ICD-10-CM

## 2018-03-20 NOTE — Addendum Note (Signed)
Addendum  created 03/20/18 1522 by Effie Berkshire, MD   Intraprocedure Event edited, Intraprocedure Staff edited

## 2018-03-24 ENCOUNTER — Encounter: Payer: Self-pay | Admitting: Cardiothoracic Surgery

## 2018-03-24 ENCOUNTER — Other Ambulatory Visit: Payer: Self-pay

## 2018-03-24 ENCOUNTER — Ambulatory Visit (INDEPENDENT_AMBULATORY_CARE_PROVIDER_SITE_OTHER): Payer: Self-pay | Admitting: Cardiothoracic Surgery

## 2018-03-24 ENCOUNTER — Ambulatory Visit
Admission: RE | Admit: 2018-03-24 | Discharge: 2018-03-24 | Disposition: A | Discharge status: Home / Self Care | Payer: 59 | Source: Ambulatory Visit | Attending: Cardiothoracic Surgery | Admitting: Cardiothoracic Surgery

## 2018-03-24 VITALS — BP 130/88 | HR 72 | Resp 18 | Ht 69.0 in | Wt 230.0 lb

## 2018-03-24 DIAGNOSIS — R0789 Other chest pain: Secondary | ICD-10-CM

## 2018-03-24 DIAGNOSIS — J984 Other disorders of lung: Secondary | ICD-10-CM | POA: Diagnosis not present

## 2018-03-24 DIAGNOSIS — Z4889 Encounter for other specified surgical aftercare: Secondary | ICD-10-CM

## 2018-03-24 NOTE — Progress Notes (Signed)
PCP is Jani Gravel, MD Referring Provider is Jani Gravel, MD  Chief Complaint  Patient presents with  . 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  The incision is healing well but is still somewhat tender.  Pathology on the tissue was negative.  Chest x-ray today is clear.  She is not taking any narcotics.  She is interested in resuming normal activities.   Past Medical History:  Diagnosis Date  . Abnormal LFTs   . Heart murmur    as a child, nothing since  . Nausea & vomiting 06/24/2017  . Protein, urine, abnormal presence 08/07/2013    Past Surgical History:  Procedure Laterality Date  . CHOLECYSTECTOMY  2001  . egg harvest surgery    . ESOPHAGOGASTRODUODENOSCOPY Left 06/25/2017   Procedure: ESOPHAGOGASTRODUODENOSCOPY (EGD);  Surgeon: Carol Ada, MD;  Location: [place];  Service: Endoscopy;  Laterality: Left;  . FOOT SURGERY     4 different surgeries  . FRACTURE SURGERY  2016    Left Elbow, plates and screws  . PILONIDAL CYST / SINUS EXCISION     twice  . RESECTION OF MEDIASTINAL MASS N/A 03/13/2018   Procedure: RESECTION OF XYPHOID;  Surgeon: Prescott Gum, Collier Salina, MD;  Location: [place];  Service: Thoracic;  Laterality: N/A;  . SHOULDER SURGERY     left shoulder x2, arthroscopy  . TONSILLECTOMY AND ADENOIDECTOMY      Family History  Problem Relation Age of Onset  . Other Mother        homicide  . Prostate cancer Father   . Anorexia nervosa Sister     Social History Social History   Tobacco Use  . Smoking status: Never Smoker  . Smokeless tobacco: Never Used  Substance Use Topics  . Alcohol use: No  . Drug use: No    Current Outpatient Medications  Medication Sig Dispense Refill  . baclofen 5 MG TABS Take 5 mg by mouth 3 (three) times daily. 30 each 0  . hydrochlorothiazide  (MICROZIDE) 12.5 MG capsule Take 12.5 mg by mouth daily as needed (swelling).    . nitroGLYCERIN (NITRODUR - DOSED IN MG/24 HR) 0.2 mg/hr patch Place 1/4 patch to the affected area daily. (Patient taking differently: Place 0.05 mg onto the skin daily. Place 1/4 patch to the affected area daily.) 30 patch 1  . predniSONE (DELTASONE) 10 MG tablet Take 10 mg by mouth daily as needed (inflammation).      No current facility-administered medications for this visit.     Allergies  Allergen Reactions  . Hydrocodone Nausea And Vomiting  . Percocet [Oxycodone-Acetaminophen] Nausea And Vomiting    Review of Systems  No fever Weight stable No change in bowel habits  BP 130/88 (BP Location: Left Arm, Patient Position: Sitting, Cuff Size: Normal)   Pulse 72   Resp 18   Ht 5' 9"  (1.753 m)   Wt 230 lb (104.3 kg)   SpO2 99% Comment: RA  BMI 33.97 kg/m  Physical Exam Clear Heart rate regular Incision clean and dry still with some Dermabond in place  Diagnostic Tests: Chest x-ray clear  Impression: Recovering well after outpatient resection of xiphoid process as focal point of her pain syndrome.  Plan: From the soft tissue surgery performed patient should expect 6 weeks of gradual improving soreness.  She knows she  can resume driving and may lift up to 20 pounds but should not resume strenuous activities such asr visits to the fitness center until after Thanksgiving.  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  She will return as needed.   Len Childs, MD [facility] [phone]

## 2018-03-25 ENCOUNTER — Ambulatory Visit: Payer: 59 | Admitting: Sports Medicine

## 2018-03-25 ENCOUNTER — Ambulatory Visit: Payer: Self-pay

## 2018-03-25 ENCOUNTER — Ambulatory Visit: Payer: Self-pay | Admitting: Cardiothoracic Surgery

## 2018-03-25 ENCOUNTER — Encounter: Payer: Self-pay | Admitting: Sports Medicine

## 2018-03-25 VITALS — BP 118/76 | Ht 69.0 in | Wt 230.0 lb

## 2018-03-25 DIAGNOSIS — G8929 Other chronic pain: Secondary | ICD-10-CM

## 2018-03-25 DIAGNOSIS — M25512 Pain in left shoulder: Secondary | ICD-10-CM
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
## 2018-03-25 NOTE — Patient Instructions (Addendum)
It was a pleasure seeing you today in our clinic. Here is the treatment plan we have discussed and agreed upon together:  Your shoulder ultrasound demonstrated that overall your rotator cuff is intact. There were small abnormalities consistent with your medical history including a subscapularis tendon with scar tissue noted. Additionally a small chronic partial tear was noted in the supraspinatus tendon.  Please continue rehab/PT exercises and nitroglycerin patches.  Schedule follow up in 2-3 months.  Please call with questions or concerns about what we discussed today.  Be well, Dr. Burr Medico

## 2018-04-02 DIAGNOSIS — I8311 Varicose veins of right lower extremity with inflammation: Secondary | ICD-10-CM | POA: Diagnosis not present

## 2018-04-09 ENCOUNTER — Encounter: Payer: Self-pay | Admitting: Internal Medicine

## 2018-04-17 DIAGNOSIS — I8311 Varicose veins of right lower extremity with inflammation: Secondary | ICD-10-CM | POA: Diagnosis not present

## 2018-08-12 DIAGNOSIS — M79645 Pain in left finger(s): Secondary | ICD-10-CM | POA: Diagnosis not present

## 2018-08-12 DIAGNOSIS — M13842 Other specified arthritis, left hand: Secondary | ICD-10-CM | POA: Diagnosis not present

## 2018-11-13 IMAGING — CT CT CHEST W/O CM
1 series · 15 of 34 positions shown, 19 images · non-contrast
Comparison: None

CLINICAL DATA: Xiphoid painful since [REDACTED], question related to
workout with heavy weights,

EXAM:
CT CHEST WITHOUT CONTRAST
TECHNIQUE: Multidetector CT imaging of the chest was performed following the
standard protocol without IV contrast. Sagittal and coronal MPR
images reconstructed from axial data set.

[Series 2: chest w/(date) · axial · 0.78mm/px · z∈[-294,-34]mm · 15 of 154 slices shown, 19 images]
[im 12/154  mediastinal]
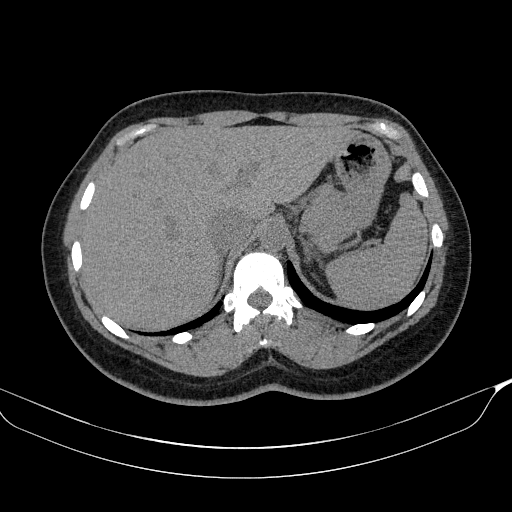
[im 12/154  lung]
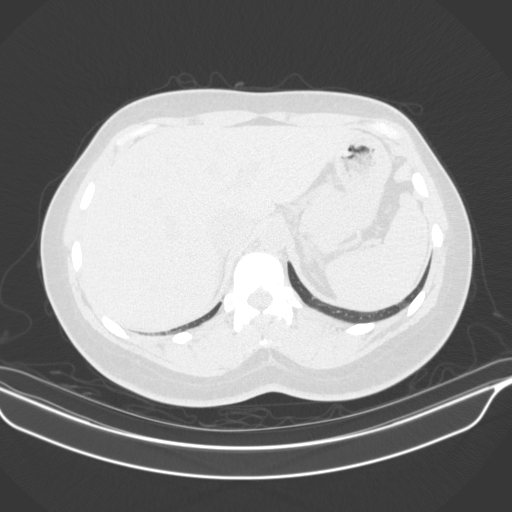
[im 23/154  lung]
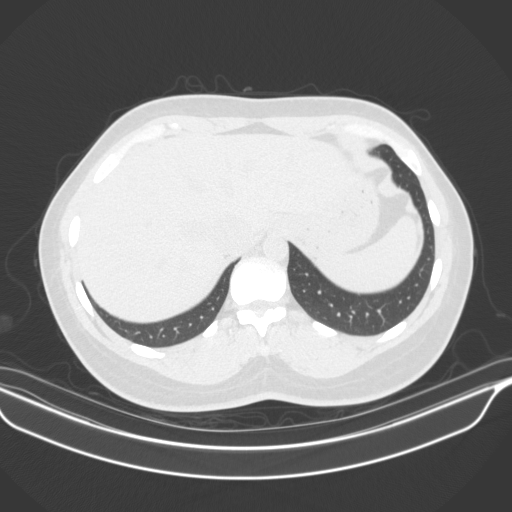
[im 31/154  lung]
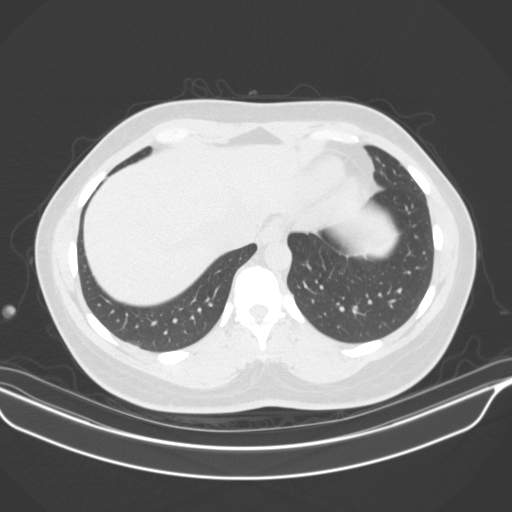
[im 40/154  lung]
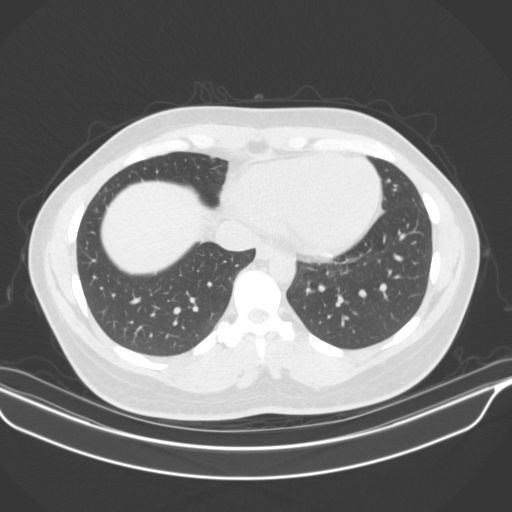
[im 52/154  mediastinal]
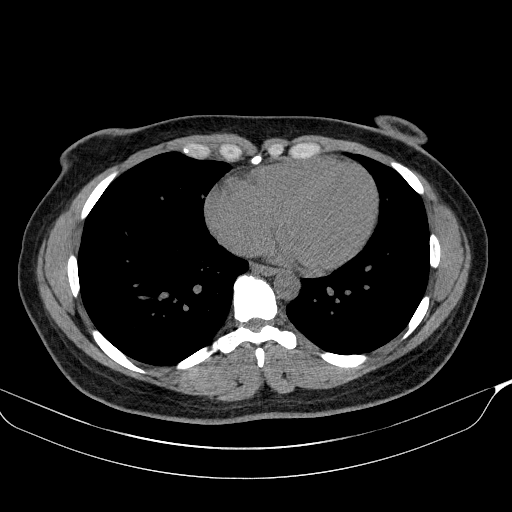
[im 52/154  lung]
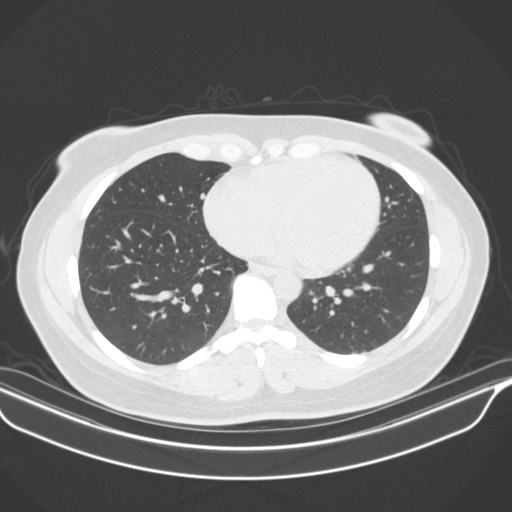
[im 62/154  lung]
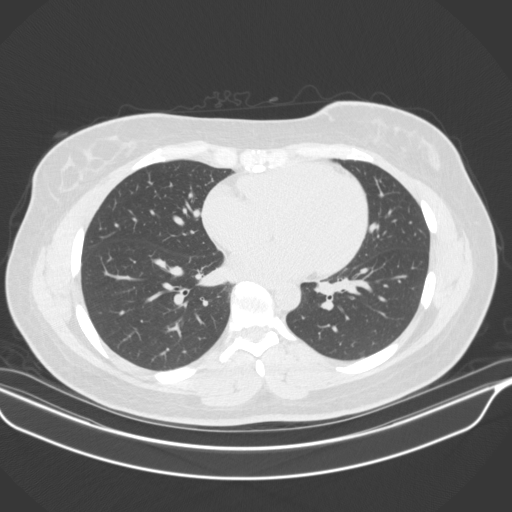
[im 69/154  lung]
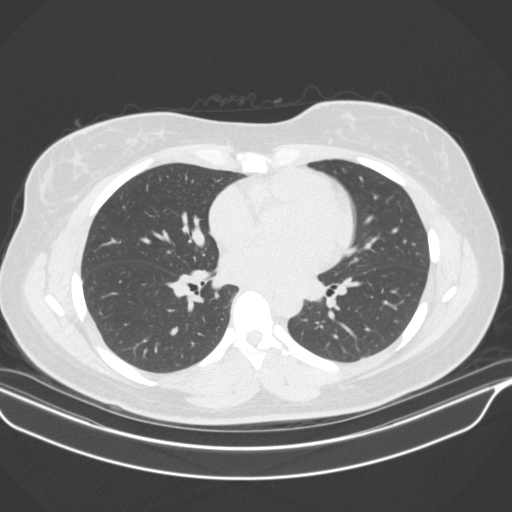
[im 80/154  lung]
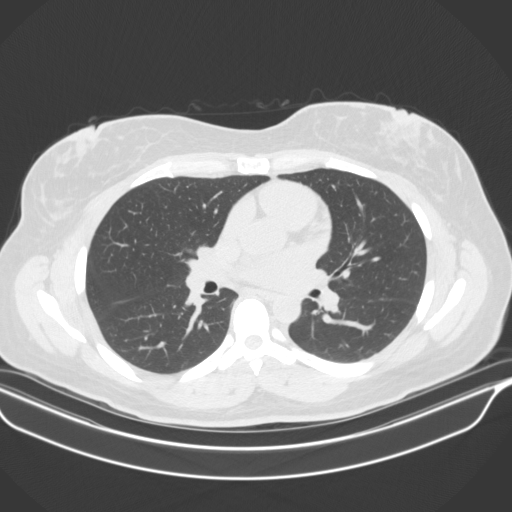
[im 86/154  mediastinal]
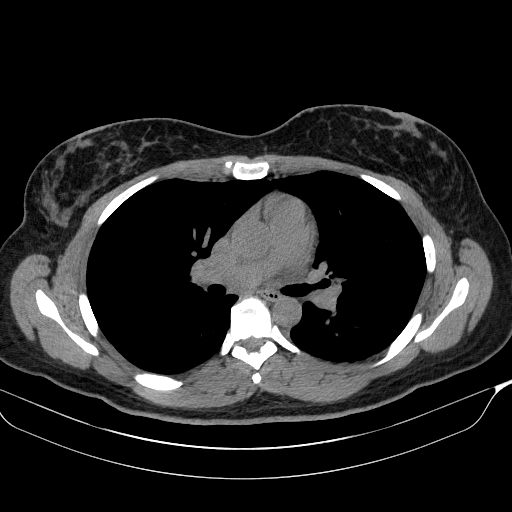
[im 86/154  lung]
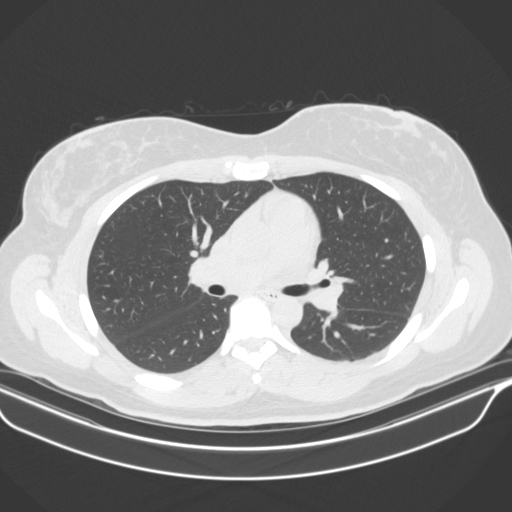
[im 92/154  lung]
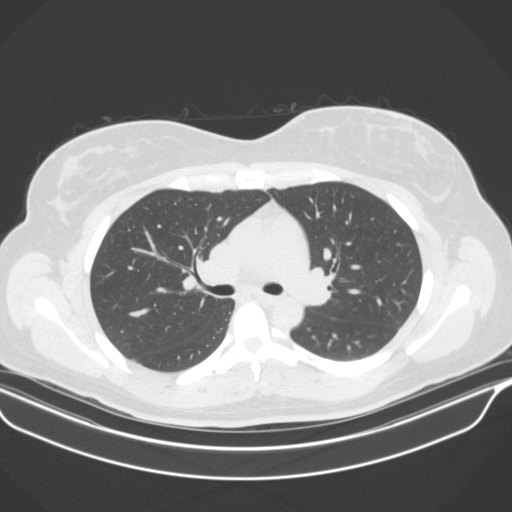
[im 103/154  lung]
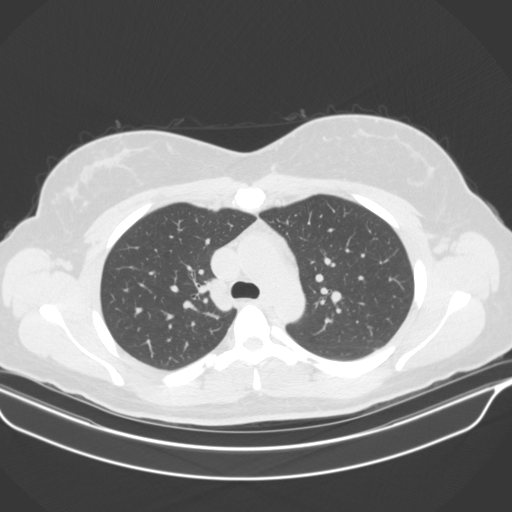
[im 114/154  lung]
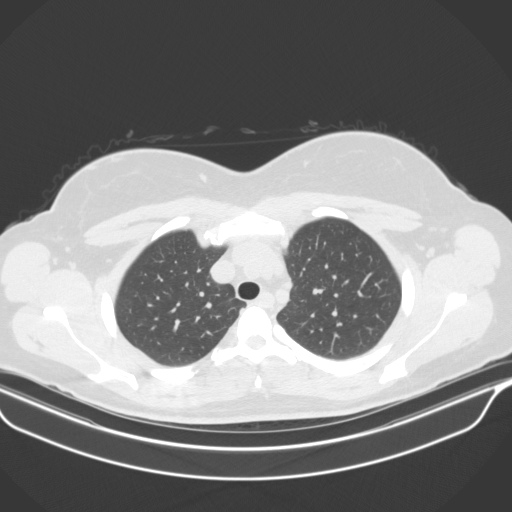
[im 123/154  mediastinal]
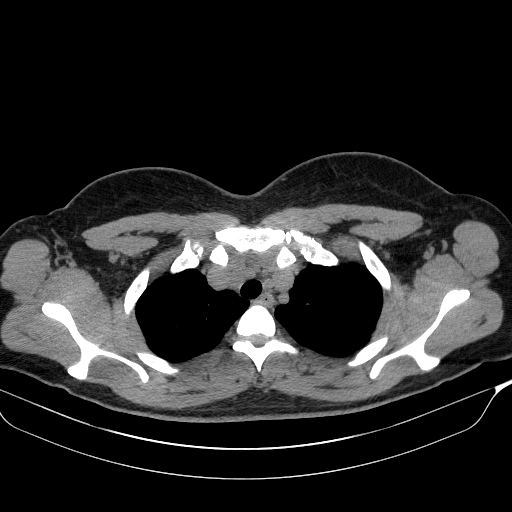
[im 123/154  lung]
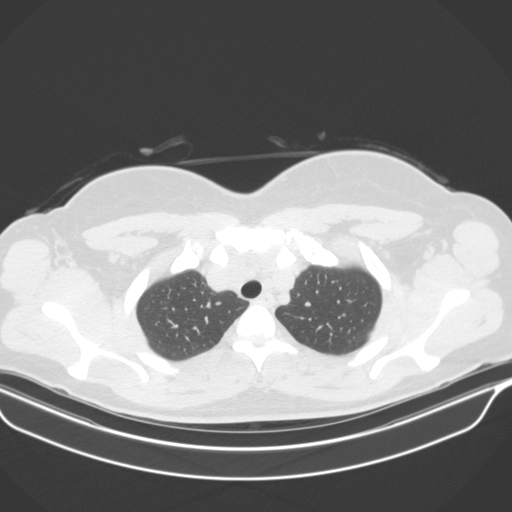
[im 131/154  lung]
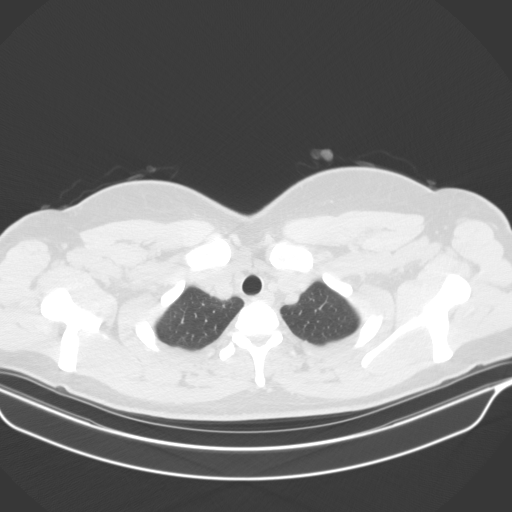
[im 142/154  lung]
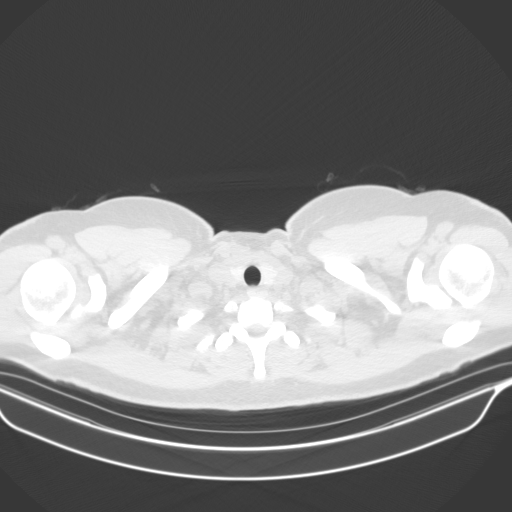

[15 of 34 positions shown; findings below may reference images not displayed]

FINDINGS: Cardiovascular: Aorta normal caliber. Heart unremarkable. No
pericardial effusion.

Mediastinum/Nodes: Esophagus normal appearance. Base of cervical
region normal appearance. No thoracic adenopathy.

Lungs/Pleura: Lungs clear. No pulmonary infiltrate, pleural effusion
or pneumothorax. No definite pulmonary mass/nodule.

Upper Abdomen: Indeterminate LEFT adrenal nodule 12 x 8 mm not
definitely seen on previous CT exams but present on a prior MR of
06/24/2017, question slightly more full in size. Visualized upper
abdomen otherwise unremarkable.

Musculoskeletal: No acute osseous abnormalities.
IMPRESSION: No acute intrathoracic abnormalities.

Indeterminate 12 x 8 mm LEFT adrenal nodule, not definitely seen on
prior CT exam but present on a prior MR from early 1912.

Lesion appears slightly more full in size versus the previous MR.

Lesion characteristics on the prior MR exam are indeterminate.

This may represent a small LEFT adrenal adenoma and follow-up
noncontrast CT imaging is recommended in 6-12 months to assess
stability.

## 2019-01-13 IMAGING — DX DG CHEST 2V
2 series · 2 of 2 positions shown · non-contrast
Comparison: Chest x-ray of 03/11/2017

CLINICAL DATA: History of pain after resection of the xiphoid

EXAM:
CHEST - 2 VIEW

[dg chest 2 view (1 of 2)]
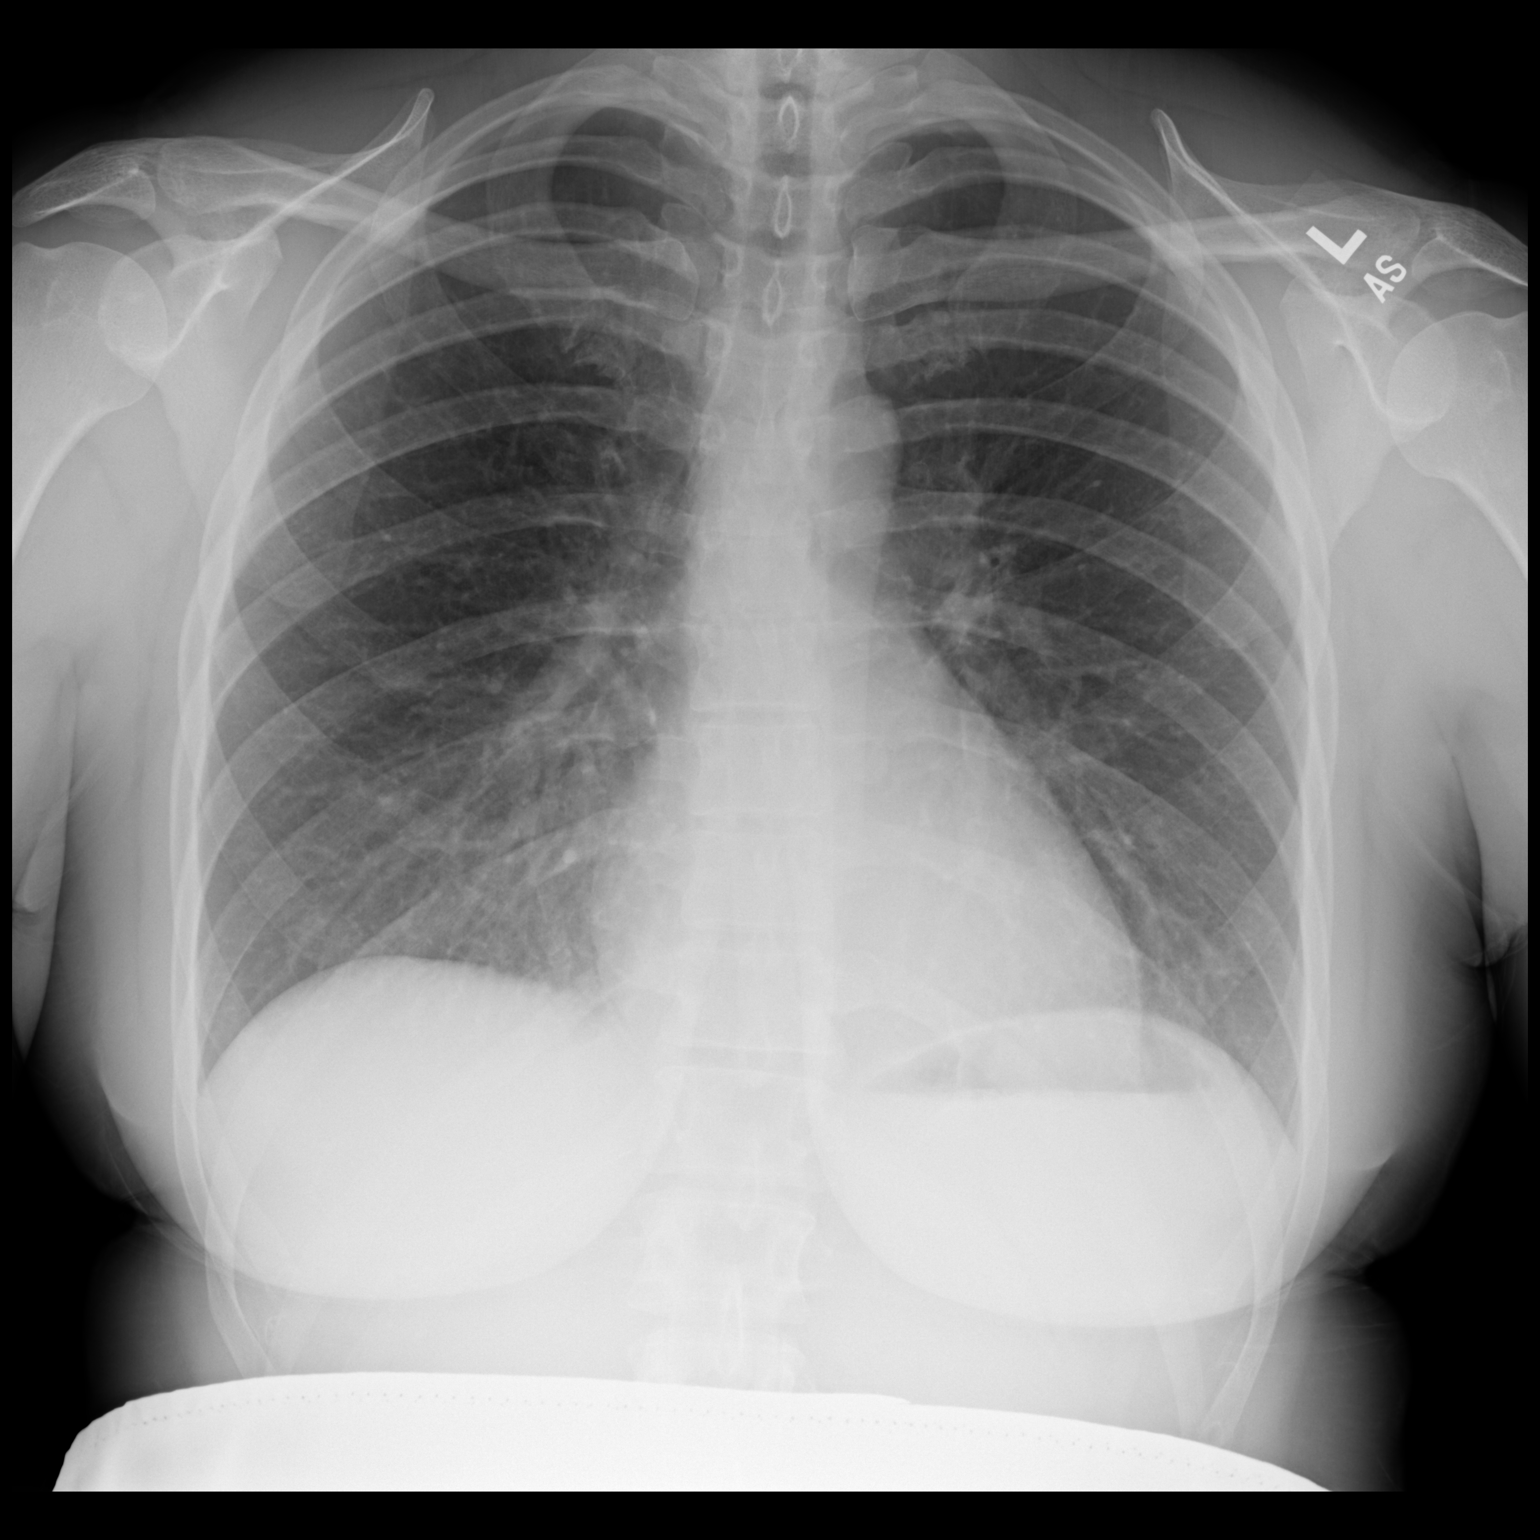

[dg chest 2 view (2 of 2)]
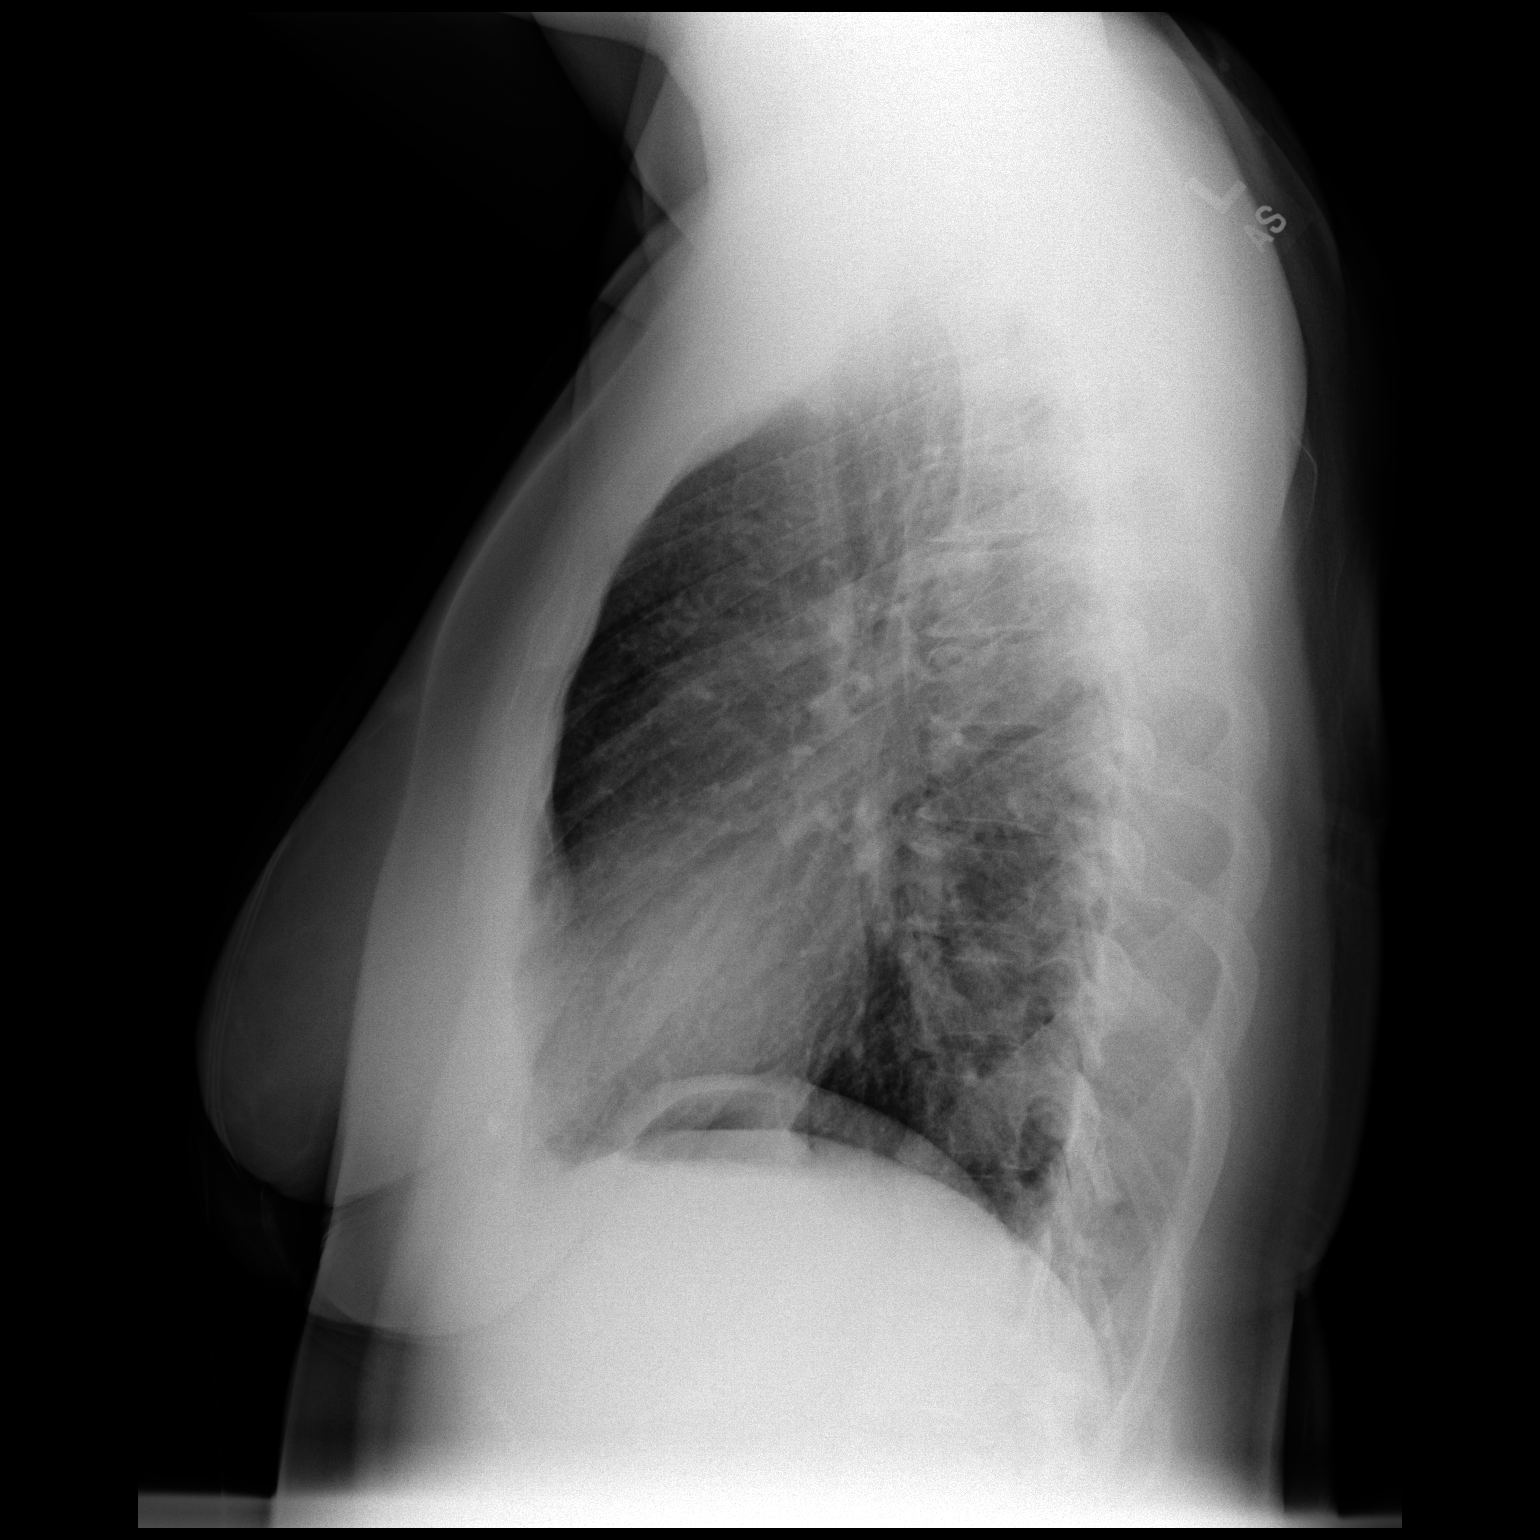

[2 of 2 positions shown; findings below may reference images not displayed]

FINDINGS: No active infiltrate or effusion is seen. The lungs are clear.
Mediastinal and hilar contours are unremarkable. The heart is within
normal limits in size. No bony abnormality is seen.
IMPRESSION: No active cardiopulmonary disease.

## 2022-02-02 ENCOUNTER — Ambulatory Visit
Admission: EM | Admit: 2022-02-02 | Discharge: 2022-02-02 | Disposition: A | Discharge status: Home / Self Care | Payer: 59 | Attending: Physician Assistant | Admitting: Physician Assistant

## 2022-02-02 DIAGNOSIS — L309 Dermatitis, unspecified: Secondary | ICD-10-CM

## 2022-02-02 MED ORDER — METHYLPREDNISOLONE ACETATE 40 MG/ML IJ SUSP
40.0000 mg | Freq: Once | INTRAMUSCULAR | Status: AC
  Administered 2022-02-02: 40 mg via INTRAMUSCULAR

## 2022-02-02 MED ORDER — PREDNISONE 20 MG PO TABS: 40.0000 mg | ORAL_TABLET | Freq: Every day | ORAL | 0 refills | Status: AC
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
## 2022-02-02 NOTE — ED Triage Notes (Signed)
Pt presents with itchy rash all over body since yesterday.

## 2023-08-26 ENCOUNTER — Other Ambulatory Visit: Payer: Self-pay | Admitting: Dermatology

## 2023-08-26 DIAGNOSIS — E069 Thyroiditis, unspecified: Secondary | ICD-10-CM

## 2023-08-27 ENCOUNTER — Emergency Department (HOSPITAL_COMMUNITY)

## 2023-08-27 ENCOUNTER — Emergency Department (HOSPITAL_COMMUNITY)
Admission: EM | Admit: 2023-08-27 | Discharge: 2023-08-27 | Disposition: A | Discharge status: Home / Self Care | Attending: Emergency Medicine | Admitting: Emergency Medicine

## 2023-08-27 ENCOUNTER — Other Ambulatory Visit: Payer: Self-pay

## 2023-08-27 DIAGNOSIS — E0590 Thyrotoxicosis, unspecified without thyrotoxic crisis or storm: Secondary | ICD-10-CM | POA: Diagnosis not present

## 2023-08-27 DIAGNOSIS — Z79899 Other long term (current) drug therapy: Secondary | ICD-10-CM | POA: Diagnosis not present

## 2023-08-27 DIAGNOSIS — R079 Chest pain, unspecified: Secondary | ICD-10-CM | POA: Diagnosis present

## 2023-08-27 LAB — CBC
HCT: 30.1 % — ABNORMAL LOW (ref 36.0–46.0)
Hemoglobin: 9.7 g/dL — ABNORMAL LOW (ref 12.0–15.0)
MCH: 29.8 pg (ref 26.0–34.0)
MCHC: 32.2 g/dL (ref 30.0–36.0)
MCV: 92.3 fL (ref 80.0–100.0)
Platelets: 393 10*3/uL (ref 150–400)
RBC: 3.26 MIL/uL — ABNORMAL LOW (ref 3.87–5.11)
RDW: 11.8 % (ref 11.5–15.5)
WBC: 6.1 10*3/uL (ref 4.0–10.5)
nRBC: 0 % (ref 0.0–0.2)

## 2023-08-27 LAB — URINALYSIS, ROUTINE W REFLEX MICROSCOPIC
Bilirubin Urine: NEGATIVE
Glucose, UA: NEGATIVE mg/dL
Hgb urine dipstick: NEGATIVE
Ketones, ur: NEGATIVE mg/dL
Leukocytes,Ua: NEGATIVE
Nitrite: NEGATIVE
Protein, ur: NEGATIVE mg/dL
Specific Gravity, Urine: 1.002 — ABNORMAL LOW (ref 1.005–1.030)
pH: 6 (ref 5.0–8.0)

## 2023-08-27 LAB — BASIC METABOLIC PANEL
Anion gap: 8 (ref 5–15)
BUN: 8 mg/dL (ref 6–20)
CO2: 23 mmol/L (ref 22–32)
Calcium: 8.3 mg/dL — ABNORMAL LOW (ref 8.9–10.3)
Chloride: 104 mmol/L (ref 98–111)
Creatinine, Ser: 0.47 mg/dL (ref 0.44–1.00)
GFR, Estimated: >60 mL/min (ref >60)
Glucose, Bld: 93 mg/dL (ref 70–99)
Potassium: 3.4 mmol/L — ABNORMAL LOW (ref 3.5–5.1)
Sodium: 135 mmol/L (ref 135–145)

## 2023-08-27 LAB — TROPONIN I (HIGH SENSITIVITY)
Troponin I (High Sensitivity): 5 ng/L (ref <18)
Troponin I (High Sensitivity): 3 ng/L (ref <18)

## 2023-08-27 LAB — HEPATIC FUNCTION PANEL
ALT: 22 U/L (ref 0–44)
AST: 30 U/L (ref 15–41)
Albumin: 3 g/dL — ABNORMAL LOW (ref 3.5–5.0)
Alkaline Phosphatase: 52 U/L (ref 38–126)
Bilirubin, Direct: <0.1 mg/dL (ref 0.0–0.2)
Total Bilirubin: 0.7 mg/dL (ref 0.0–1.2)
Total Protein: 6.5 g/dL (ref 6.5–8.1)

## 2023-08-27 LAB — LIPASE, BLOOD: Lipase: 29 U/L (ref 11–51)

## 2023-08-27 LAB — TSH: TSH: <0.01 u[IU]/mL — ABNORMAL LOW (ref 0.350–4.500)

## 2023-08-27 LAB — D-DIMER, QUANTITATIVE: D-Dimer, Quant: 0.6 ug{FEU}/mL — ABNORMAL HIGH (ref 0.00–0.50)

## 2023-08-27 LAB — PREGNANCY, URINE: Preg Test, Ur: NEGATIVE

## 2023-08-27 MED ORDER — SODIUM CHLORIDE 0.9 % IV BOLUS
1000.0000 mL | Freq: Once | INTRAVENOUS | Status: AC
  Administered 2023-08-27: 1000 mL via INTRAVENOUS

## 2023-08-27 MED ORDER — IOHEXOL 350 MG/ML SOLN
75.0000 mL | Freq: Once | INTRAVENOUS | Status: AC | PRN
  Administered 2023-08-27: 75 mL via INTRAVENOUS

## 2023-08-27 NOTE — ED Provider Notes (Signed)
 [place] EMERGENCY DEPARTMENT AT [facility] Provider Note   CSN: [id number] Arrival date & time: 08/27/23  1428     History  Chief Complaint  Patient presents with   Abnormal Lab   Chest Pain    Maureen Ballard is a 43 y.o. female.  Pt is a 43 yo female with pmhx significant for Hashimoto's disease.  Pt has been having intermittent cp in her lower chest.  She also went to UC and had some labs which were abnormal.  She does not know what they were (maybe thyroid), but went to [facility] today to get them rechecked.  Pt developed cp while there.  EMS was called and she was given fentanyl (100 mcg) and zofran en route.  Cp is gone now.  Pt has been on steroids for a sore throat.       Home Medications Prior to Admission medications   Medication Sig Start Date End Date Taking? Authorizing Provider  baclofen 5 MG TABS Take 5 mg by mouth 3 (three) times daily. 06/28/17   Darlin Drop, DO  hydrochlorothiazide (MICROZIDE) 12.5 MG capsule Take 12.5 mg by mouth daily as needed (swelling).    [provider]  nitroGLYCERIN (NITRODUR - DOSED IN MG/24 HR) 0.2 mg/hr patch Place 1/4 patch to the affected area daily. Patient taking differently: Place 0.05 mg onto the skin daily. Place 1/4 patch to the affected area daily. 12/17/17   Enid Baas, MD      Allergies    Hydrocodone and Percocet [oxycodone-acetaminophen]    Review of Systems   Review of Systems  Cardiovascular:  Positive for chest pain.  All other systems reviewed and are negative.   Physical Exam Updated Vital Signs BP 124/70 (BP Location: Left Arm)   Pulse 92   Temp 98.4 F (36.9 C) (Oral)   Resp 16   SpO2 97%  Physical Exam Vitals and nursing note reviewed.  Constitutional:      Appearance: She is well-developed.  HENT:     Head: Normocephalic and atraumatic.  Eyes:     Extraocular Movements: Extraocular movements intact.     Pupils: Pupils are equal, round, and reactive to light.   Cardiovascular:     Rate and Rhythm: Normal rate and regular rhythm.     Heart sounds: Normal heart sounds.  Pulmonary:     Effort: Pulmonary effort is normal.     Breath sounds: Normal breath sounds.  Chest:    Abdominal:     General: Bowel sounds are normal.     Palpations: Abdomen is soft.  Musculoskeletal:        General: Normal range of motion.     Cervical back: Normal range of motion and neck supple.  Skin:    General: Skin is warm.     Capillary Refill: Capillary refill takes less than 2 seconds.  Neurological:     General: No focal deficit present.     Mental Status: She is alert and oriented to person, place, and time.  Psychiatric:        Mood and Affect: Mood normal.        Behavior: Behavior normal.     ED Results / Procedures / Treatments   Labs (all labs ordered are listed, but only abnormal results are displayed) Labs Reviewed  BASIC METABOLIC PANEL  CBC  HEPATIC FUNCTION PANEL  TSH  D-DIMER, QUANTITATIVE  LIPASE, BLOOD  URINALYSIS, ROUTINE W REFLEX MICROSCOPIC  PREGNANCY, URINE  TROPONIN I (  HIGH SENSITIVITY)  TROPONIN I (HIGH SENSITIVITY)    EKG EKG Interpretation Date/Time:  Tuesday August 27 2023 14:54:27 EDT Ventricular Rate:  74 PR Interval:  154 QRS Duration:  90 QT Interval:  410 QTC Calculation: 455 R Axis:   70  Text Interpretation: Sinus arrhythmia duplicate Confirmed by Jacalyn Lefevre [phone] on 08/27/2023 3:23:50 PM  Radiology No results found.  Procedures Procedures    Medications Ordered in ED Medications  sodium chloride 0.9 % bolus 1,000 mL (has no administration in time range)    ED Course/ Medical Decision Making/ A&P                                 Medical Decision Making Amount and/or Complexity of Data Reviewed Labs: ordered. Radiology: ordered.  This patient presents to the ED for concern of cp, this involves an extensive number of treatment options, and is a complaint that carries with it a high risk  of complications and morbidity.  The differential diagnosis includes cardiac, pulm, gi   Co morbidities that complicate the patient evaluation  Hashimoto's disease   Additional history obtained:  Additional history obtained from epic chart review External records from outside source obtained and reviewed including EMS report   Lab Tests:  pending at shift change   Imaging Studies ordered:  I ordered imaging studies including cxr  Reading pending at shift change   Cardiac Monitoring:  The patient was maintained on a cardiac monitor.  I personally viewed and interpreted the cardiac monitored which showed an underlying rhythm of: nsr   Medicines ordered and prescription drug management:  I ordered medication including ivfs  for sx  Reevaluation of the patient after these medicines showed that the patient improved I have reviewed the patients home medicines and have made adjustments as needed   Test Considered:  ct   Problem List / ED Course:  CP:  work up pending at shift change   Social Determinants of Health:  Lives at home   Dispostion:  Pending at shift change        Final Clinical Impression(s) / ED Diagnoses Final diagnoses:  Chest pain, unspecified type    Rx / DC Orders ED Discharge Orders     None         Jacalyn Lefevre, MD 08/27/23 1648

## 2023-08-27 NOTE — ED Triage Notes (Addendum)
 Pt arrives via EMS from [facility] co chest pain and abnormal labs. Pt was at [facility] for repeat labwork when started having sharp chest pains. Pt had blood drawn a few days ago with abnormal results related to thyroid and then was put on prednisone on Saturday. Diagnosed with Hashimotos in May. EMS gave fentanyl, 4mg  zofran en route. 20LAC, CBG 106 Hx xyphoid process removed

## 2023-08-27 NOTE — Discharge Instructions (Signed)
 The evaluation in the ED did not show any signs of heart attack, aneurysm, blood clot or other serious abnormality.    Follow-up with your doctor as planned for further treatment and evaluation of your hyperthyroidism.

## 2023-08-27 NOTE — ED Provider Notes (Signed)
 Clinical Course as of 08/27/23 2243  Tue Aug 27, 2023  1816 Urinalysis, Routine w reflex microscopic -Urine, Clean Catch(!) Analysis negative pregnancy test negative. [JK]  1816 CBC(!) Hemoglobin decreased compared to previous values several years ago [JK]  1816 D-dimer, quantitative(!) [JK]  1817 D-dimer slightly elevated [JK]  1817 Chest x-ray without acute findings [JK]  1914 Lipase, blood Pregnancy test negative.  Urinalysis negative.  Metabolic panel normal. [JK]  1915 TSH(!) TSH is low [JK]    Clinical Course User Index [JK] Linwood Dibbles, MD   Patient was initially seen by Dr. Particia Nearing.  Please see her note. Patient's ED workup is reassuring.  Serial troponins were normal.  Patient's D-dimer slightly elevated.  CT angio was performed and it did not show any ends of PE aneurysm or other acute abnormality. Patient has been recently diagnosed with hyperthyroidism.  She has an ultrasound scheduled tomorrow.  Findings in the ED not suggestive of thyroid storm.  Etiology of her chest pain unclear but no signs of serious abnormality. Evaluation and diagnostic testing in the emergency department does not suggest an emergent condition requiring admission or immediate intervention beyond what has been performed at this time.  The patient is safe for discharge and has been instructed to return immediately for worsening symptoms, change in symptoms or any other concerns.    Linwood Dibbles, MD 08/27/23 2244

## 2023-08-28 ENCOUNTER — Ambulatory Visit
Admission: RE | Admit: 2023-08-28 | Discharge: 2023-08-28 | Disposition: A | Discharge status: Home / Self Care | Source: Ambulatory Visit | Attending: Dermatology | Admitting: Dermatology

## 2023-08-28 DIAGNOSIS — E069 Thyroiditis, unspecified: Secondary | ICD-10-CM

## 2023-09-12 ENCOUNTER — Other Ambulatory Visit: Payer: Self-pay | Admitting: Dermatology

## 2023-09-12 DIAGNOSIS — E041 Nontoxic single thyroid nodule: Secondary | ICD-10-CM

## 2023-09-25 ENCOUNTER — Other Ambulatory Visit (HOSPITAL_COMMUNITY)
Admission: RE | Admit: 2023-09-25 | Discharge: 2023-09-25 | Disposition: A | Discharge status: Home / Self Care | Source: Ambulatory Visit | Attending: Dermatology | Admitting: Dermatology

## 2023-09-25 ENCOUNTER — Ambulatory Visit
Admission: RE | Admit: 2023-09-25 | Discharge: 2023-09-25 | Disposition: A | Discharge status: Home / Self Care | Source: Ambulatory Visit | Attending: Dermatology

## 2023-09-25 DIAGNOSIS — E041 Nontoxic single thyroid nodule: Secondary | ICD-10-CM

## 2023-09-27 LAB — CYTOLOGY - NON PAP
